# Patient Record
Sex: Female | Born: 1950 | Race: Black or African American | Hispanic: No | Marital: Single | Smoking: Former smoker
Health system: Southern US, Community
[De-identification: ages and names within clinical notes are randomized; demographics above are authoritative.]

## PROBLEM LIST (undated history)

## (undated) DIAGNOSIS — E785 Hyperlipidemia, unspecified: Secondary | ICD-10-CM

## (undated) DIAGNOSIS — M1990 Unspecified osteoarthritis, unspecified site: Secondary | ICD-10-CM

## (undated) DIAGNOSIS — E559 Vitamin D deficiency, unspecified: Secondary | ICD-10-CM

## (undated) DIAGNOSIS — E669 Obesity, unspecified: Secondary | ICD-10-CM

## (undated) DIAGNOSIS — B351 Tinea unguium: Secondary | ICD-10-CM

## (undated) DIAGNOSIS — R011 Cardiac murmur, unspecified: Secondary | ICD-10-CM

## (undated) DIAGNOSIS — F1010 Alcohol abuse, uncomplicated: Secondary | ICD-10-CM

## (undated) DIAGNOSIS — L659 Nonscarring hair loss, unspecified: Secondary | ICD-10-CM

## (undated) DIAGNOSIS — I110 Hypertensive heart disease with heart failure: Secondary | ICD-10-CM

## (undated) DIAGNOSIS — I509 Heart failure, unspecified: Secondary | ICD-10-CM

## (undated) DIAGNOSIS — I10 Essential (primary) hypertension: Secondary | ICD-10-CM

## (undated) DIAGNOSIS — F17221 Nicotine dependence, chewing tobacco, in remission: Secondary | ICD-10-CM

## (undated) DIAGNOSIS — F1910 Other psychoactive substance abuse, uncomplicated: Secondary | ICD-10-CM

## (undated) HISTORY — DX: Nonscarring hair loss, unspecified: L65.9

## (undated) HISTORY — DX: Essential (primary) hypertension: I10

## (undated) HISTORY — DX: Heart failure, unspecified: I50.9

## (undated) HISTORY — DX: Tinea unguium: B35.1

## (undated) HISTORY — DX: Other psychoactive substance abuse, uncomplicated: F19.10

## (undated) HISTORY — DX: Obesity, unspecified: E66.9

## (undated) HISTORY — DX: Hyperlipidemia, unspecified: E78.5

## (undated) HISTORY — DX: Unspecified osteoarthritis, unspecified site: M19.90

## (undated) HISTORY — DX: Hypertensive heart disease with heart failure: I11.0

## (undated) HISTORY — DX: Alcohol abuse, uncomplicated: F10.10

## (undated) HISTORY — DX: Cardiac murmur, unspecified: R01.1

## (undated) HISTORY — DX: Nicotine dependence, chewing tobacco, in remission: F17.221

## (undated) HISTORY — DX: Vitamin D deficiency, unspecified: E55.9

---

## 1998-06-02 ENCOUNTER — Emergency Department (HOSPITAL_COMMUNITY): Admission: EM | Admit: 1998-06-02 | Discharge: 1998-06-02 | Payer: Self-pay | Admitting: Emergency Medicine

## 1999-08-17 ENCOUNTER — Encounter: Payer: Self-pay | Admitting: *Deleted

## 1999-08-17 ENCOUNTER — Emergency Department (HOSPITAL_COMMUNITY): Admission: EM | Admit: 1999-08-17 | Discharge: 1999-08-17 | Payer: Self-pay | Admitting: *Deleted

## 1999-08-24 ENCOUNTER — Ambulatory Visit (HOSPITAL_COMMUNITY): Admission: RE | Admit: 1999-08-24 | Discharge: 1999-08-24 | Payer: Self-pay | Admitting: *Deleted

## 1999-08-24 ENCOUNTER — Other Ambulatory Visit: Admission: RE | Admit: 1999-08-24 | Discharge: 1999-08-24 | Payer: Self-pay | Admitting: *Deleted

## 2000-04-04 ENCOUNTER — Encounter: Admission: RE | Admit: 2000-04-04 | Discharge: 2000-04-04 | Payer: Self-pay | Admitting: *Deleted

## 2000-04-04 ENCOUNTER — Encounter: Payer: Self-pay | Admitting: *Deleted

## 2000-05-16 ENCOUNTER — Ambulatory Visit (HOSPITAL_COMMUNITY): Admission: RE | Admit: 2000-05-16 | Discharge: 2000-05-16 | Payer: Self-pay | Admitting: Cardiology

## 2000-05-16 ENCOUNTER — Encounter: Payer: Self-pay | Admitting: Cardiology

## 2000-06-08 ENCOUNTER — Ambulatory Visit (HOSPITAL_COMMUNITY): Admission: RE | Admit: 2000-06-08 | Discharge: 2000-06-08 | Payer: Self-pay | Admitting: Gastroenterology

## 2000-06-08 ENCOUNTER — Encounter: Payer: Self-pay | Admitting: Gastroenterology

## 2001-09-18 ENCOUNTER — Encounter: Payer: Self-pay | Admitting: *Deleted

## 2001-09-18 ENCOUNTER — Encounter: Admission: RE | Admit: 2001-09-18 | Discharge: 2001-09-18 | Payer: Self-pay | Admitting: *Deleted

## 2001-10-25 ENCOUNTER — Encounter (INDEPENDENT_AMBULATORY_CARE_PROVIDER_SITE_OTHER): Payer: Self-pay | Admitting: Specialist

## 2001-10-25 ENCOUNTER — Ambulatory Visit (HOSPITAL_COMMUNITY): Admission: RE | Admit: 2001-10-25 | Discharge: 2001-10-25 | Payer: Self-pay | Admitting: Obstetrics and Gynecology

## 2003-04-29 ENCOUNTER — Encounter: Admission: RE | Admit: 2003-04-29 | Discharge: 2003-04-29 | Payer: Self-pay | Admitting: Internal Medicine

## 2003-04-29 ENCOUNTER — Encounter: Payer: Self-pay | Admitting: Internal Medicine

## 2003-09-29 ENCOUNTER — Ambulatory Visit (HOSPITAL_COMMUNITY): Admission: RE | Admit: 2003-09-29 | Discharge: 2003-09-29 | Payer: Self-pay | Admitting: Gastroenterology

## 2008-09-05 ENCOUNTER — Emergency Department (HOSPITAL_COMMUNITY): Admission: EM | Admit: 2008-09-05 | Discharge: 2008-09-06 | Payer: Self-pay | Admitting: Emergency Medicine

## 2008-12-08 ENCOUNTER — Ambulatory Visit (HOSPITAL_COMMUNITY): Admission: RE | Admit: 2008-12-08 | Discharge: 2008-12-08 | Payer: Self-pay | Admitting: Obstetrics and Gynecology

## 2009-08-26 ENCOUNTER — Encounter: Admission: RE | Admit: 2009-08-26 | Discharge: 2009-09-24 | Payer: Self-pay | Admitting: Occupational Medicine

## 2009-10-08 ENCOUNTER — Ambulatory Visit: Payer: Self-pay | Admitting: Sports Medicine

## 2009-10-08 DIAGNOSIS — M719 Bursopathy, unspecified: Secondary | ICD-10-CM

## 2009-10-08 DIAGNOSIS — M75120 Complete rotator cuff tear or rupture of unspecified shoulder, not specified as traumatic: Secondary | ICD-10-CM

## 2009-10-08 DIAGNOSIS — M66329 Spontaneous rupture of flexor tendons, unspecified upper arm: Secondary | ICD-10-CM

## 2009-10-08 DIAGNOSIS — M67919 Unspecified disorder of synovium and tendon, unspecified shoulder: Secondary | ICD-10-CM | POA: Insufficient documentation

## 2009-10-12 ENCOUNTER — Encounter: Payer: Self-pay | Admitting: Sports Medicine

## 2009-11-03 ENCOUNTER — Encounter (INDEPENDENT_AMBULATORY_CARE_PROVIDER_SITE_OTHER): Payer: Self-pay | Admitting: *Deleted

## 2009-11-14 ENCOUNTER — Ambulatory Visit (HOSPITAL_COMMUNITY): Admission: RE | Admit: 2009-11-14 | Discharge: 2009-11-14 | Payer: Self-pay | Admitting: Sports Medicine

## 2010-03-24 ENCOUNTER — Encounter: Admission: RE | Admit: 2010-03-24 | Discharge: 2010-03-24 | Payer: Self-pay | Admitting: Specialist

## 2010-03-26 ENCOUNTER — Ambulatory Visit (HOSPITAL_BASED_OUTPATIENT_CLINIC_OR_DEPARTMENT_OTHER): Admission: RE | Admit: 2010-03-26 | Discharge: 2010-03-27 | Payer: Self-pay | Admitting: Specialist

## 2010-04-26 ENCOUNTER — Encounter: Admission: RE | Admit: 2010-04-26 | Discharge: 2010-07-23 | Payer: Self-pay | Admitting: Specialist

## 2010-11-08 ENCOUNTER — Encounter
Admission: RE | Admit: 2010-11-08 | Discharge: 2010-12-21 | Payer: Self-pay | Source: Home / Self Care | Attending: Specialist | Admitting: Specialist

## 2010-12-26 HISTORY — PX: ROTATOR CUFF REPAIR: SHX139

## 2011-03-18 LAB — CBC
HCT: 42 % (ref 36.0–46.0)
MCHC: 32.8 g/dL (ref 30.0–36.0)
MCV: 102.7 fL — ABNORMAL HIGH (ref 78.0–100.0)
Platelets: 263 10*3/uL (ref 150–400)
RDW: 13.9 % (ref 11.5–15.5)

## 2011-03-18 LAB — BASIC METABOLIC PANEL
BUN: 12 mg/dL (ref 6–23)
CO2: 28 mEq/L (ref 19–32)
Chloride: 105 mEq/L (ref 96–112)
GFR calc Af Amer: >60 mL/min (ref >60)
Glucose, Bld: 93 mg/dL (ref 70–99)
Potassium: 4.6 mEq/L (ref 3.5–5.1)

## 2011-05-13 NOTE — Procedures (Signed)
[facility]  Patient:    Megan Haney, Megan Haney Visit Number: [id number] MRN: [id number]          Service Type: DSU Location: DAY Attending Physician:  Lendon Colonel Dictated by:   Kathie Rhodes. Kyra Manges, M.D. Proc. Date: 10/25/01 Admit Date:  10/25/2001 Discharge Date: 10/25/2001                             Procedure Report  PREOPERATIVE DIAGNOSIS: Abnormal uterine bleeding with menorrhagia.  Uterine fibroids.  POSTOPERATIVE DIAGNOSIS:  Abnormal uterine bleeding with menorrhagia.  Uterine fibroids.  OPERATION:  Hysteroscopy with resection of large posterior cervical fibroid.  PROCEDURE:  The patient was placed in the lithotomy position and prepped and draped in the usual fashion.  A large myoma was resected with the resectoscope posteriorly.  There was an anterior indentation, but it was not resectable. All of the fibroid that was removed was resected.  During the early part of the procedure, the patient bucked, and the tenaculum tore through the cervix. As a result of this, I had to suture the cervix with 0 chromic sutures x2.  I injected the uterus with about 20 cc of a Pitressin solution.  Megan Haney tolerated the procedure well.  I did not see anything in the myoma that looked suspicious. Dictated by:   S. Kyra Manges, M.D. Attending Physician:  Lendon Colonel DD:  10/25/01 TD:  10/28/01 Job: [id number]

## 2011-09-28 LAB — POCT I-STAT, CHEM 8
BUN: 12
Glucose, Bld: 94
Potassium: 3.9
Sodium: 138

## 2011-09-28 LAB — CBC
Hemoglobin: 13.9
MCHC: 34.4
RBC: 4.06
RDW: 14.1
WBC: 10.1

## 2011-09-28 LAB — DIFFERENTIAL
Lymphocytes Relative: 23
Monocytes Relative: 7

## 2012-09-13 ENCOUNTER — Encounter: Payer: Self-pay | Admitting: Gastroenterology

## 2013-04-04 ENCOUNTER — Other Ambulatory Visit: Payer: Self-pay | Admitting: Occupational Medicine

## 2013-04-04 ENCOUNTER — Ambulatory Visit: Payer: Self-pay

## 2013-04-04 DIAGNOSIS — R7612 Nonspecific reaction to cell mediated immunity measurement of gamma interferon antigen response without active tuberculosis: Secondary | ICD-10-CM

## 2013-09-20 ENCOUNTER — Encounter (HOSPITAL_COMMUNITY): Payer: Self-pay | Admitting: Pharmacist

## 2013-09-27 ENCOUNTER — Inpatient Hospital Stay (HOSPITAL_COMMUNITY)
Admission: RE | Admit: 2013-09-27 | Discharge: 2013-09-27 | Disposition: A | Discharge status: Home / Self Care | Payer: Self-pay | Source: Ambulatory Visit

## 2013-10-03 ENCOUNTER — Encounter (HOSPITAL_COMMUNITY)
Admission: RE | Admit: 2013-10-03 | Discharge: 2013-10-03 | Disposition: A | Discharge status: Home / Self Care | Payer: 59 | Source: Ambulatory Visit | Attending: Obstetrics and Gynecology | Admitting: Obstetrics and Gynecology

## 2013-10-03 ENCOUNTER — Encounter (HOSPITAL_COMMUNITY): Payer: Self-pay | Admitting: Pharmacy Technician

## 2013-10-03 ENCOUNTER — Other Ambulatory Visit: Payer: Self-pay

## 2013-10-03 ENCOUNTER — Encounter (HOSPITAL_COMMUNITY): Payer: Self-pay

## 2013-10-03 LAB — BASIC METABOLIC PANEL
Calcium: 9 mg/dL (ref 8.4–10.5)
Chloride: 108 mEq/L (ref 96–112)
Creatinine, Ser: 0.66 mg/dL (ref 0.50–1.10)
GFR calc Af Amer: >90 mL/min (ref >90)
GFR calc non Af Amer: >90 mL/min (ref >90)
Glucose, Bld: 106 mg/dL — ABNORMAL HIGH (ref 70–99)
Potassium: 3.4 mEq/L — ABNORMAL LOW (ref 3.5–5.1)
Sodium: 139 mEq/L (ref 135–145)

## 2013-10-03 LAB — CBC
HCT: 35.5 % — ABNORMAL LOW (ref 36.0–46.0)
MCH: 33.7 pg (ref 26.0–34.0)
RDW: 12.9 % (ref 11.5–15.5)
WBC: 7.3 10*3/uL (ref 4.0–10.5)

## 2013-10-03 NOTE — Patient Instructions (Addendum)
Your procedure is scheduled on: 10/04/2013  Enter through the Main Entrance of [facility] at: 0600AM  Pick up the phone at the desk and dial [phone].  Call this number if you have problems the morning of surgery: [phone].  Remember: Do NOT eat food:AFTER MIDNIGHT 10/03/2013 Do NOT drink clear liquids after: AFTER MIDNIGHT 10/03/2013 Take these medicines the morning of surgery with a SIP OF WATER: none  Do NOT wear jewelry (body piercing), make-up, or nail polish. Do NOT wear lotions, powders, or perfumes.  You may wear deoderant. Do NOT shave for 48 hours prior to surgery. Do NOT bring valuables to the hospital. Contacts, dentures, or bridgework may not be worn into surgery. Have a responsible adult drive you home and stay with you for 24 hours after your procedure.

## 2013-10-04 ENCOUNTER — Encounter (HOSPITAL_COMMUNITY)
Admission: RE | Disposition: A | Discharge status: Home / Self Care | Payer: Self-pay | Source: Ambulatory Visit | Attending: Obstetrics and Gynecology

## 2013-10-04 ENCOUNTER — Encounter (HOSPITAL_COMMUNITY): Payer: 59 | Admitting: Anesthesiology

## 2013-10-04 ENCOUNTER — Ambulatory Visit (HOSPITAL_COMMUNITY)
Admission: RE | Admit: 2013-10-04 | Discharge: 2013-10-04 | Disposition: A | Discharge status: Home / Self Care | Payer: 59 | Source: Ambulatory Visit | Attending: Obstetrics and Gynecology | Admitting: Obstetrics and Gynecology

## 2013-10-04 ENCOUNTER — Ambulatory Visit (HOSPITAL_COMMUNITY): Payer: 59 | Admitting: Anesthesiology

## 2013-10-04 DIAGNOSIS — N950 Postmenopausal bleeding: Secondary | ICD-10-CM | POA: Insufficient documentation

## 2013-10-04 HISTORY — PX: DILATATION & CURETTAGE/HYSTEROSCOPY WITH TRUECLEAR: SHX6353

## 2013-10-04 SURGERY — DILATATION & CURETTAGE/HYSTEROSCOPY WITH TRUCLEAR
Anesthesia: General | Wound class: Clean Contaminated

## 2013-10-04 MED ORDER — FENTANYL CITRATE 0.05 MG/ML IJ SOLN: 25.0000 ug | INTRAMUSCULAR | Status: DC | PRN

## 2013-10-04 MED ORDER — FENTANYL CITRATE 0.05 MG/ML IJ SOLN
INTRAMUSCULAR | Status: DC | PRN
  Administered 2013-10-04 (×2): 25 ug via INTRAVENOUS
  Administered 2013-10-04: 50 ug via INTRAVENOUS

## 2013-10-04 MED ORDER — CEFAZOLIN SODIUM-DEXTROSE 2-3 GM-% IV SOLR
2.0000 g | INTRAVENOUS | Status: AC
  Administered 2013-10-04: 2 g via INTRAVENOUS

## 2013-10-04 MED ORDER — LIDOCAINE HCL (CARDIAC) 20 MG/ML IV SOLN
INTRAVENOUS | Status: DC | PRN
  Administered 2013-10-04: 30 mg via INTRAVENOUS

## 2013-10-04 MED ORDER — LIDOCAINE HCL 1 % IJ SOLN
INTRAMUSCULAR | Status: AC
  Filled 2013-10-04: qty 20

## 2013-10-04 MED ORDER — LACTATED RINGERS IV SOLN
INTRAVENOUS | Status: DC
  Administered 2013-10-04: 07:00:00 via INTRAVENOUS

## 2013-10-04 MED ORDER — MEPERIDINE HCL 25 MG/ML IJ SOLN: 6.2500 mg | INTRAMUSCULAR | Status: DC | PRN

## 2013-10-04 MED ORDER — MIDAZOLAM HCL 2 MG/2ML IJ SOLN
INTRAMUSCULAR | Status: AC
  Filled 2013-10-04: qty 2

## 2013-10-04 MED ORDER — MIDAZOLAM HCL 2 MG/2ML IJ SOLN: 0.5000 mg | Freq: Once | INTRAMUSCULAR | Status: DC | PRN

## 2013-10-04 MED ORDER — KETOROLAC TROMETHAMINE 30 MG/ML IJ SOLN
INTRAMUSCULAR | Status: DC | PRN
  Administered 2013-10-04: 15 mg via INTRAMUSCULAR

## 2013-10-04 MED ORDER — PROPOFOL 10 MG/ML IV BOLUS
INTRAVENOUS | Status: DC | PRN
  Administered 2013-10-04: 20 mg via INTRAVENOUS
  Administered 2013-10-04: 150 mg via INTRAVENOUS

## 2013-10-04 MED ORDER — LIDOCAINE HCL (CARDIAC) 20 MG/ML IV SOLN
INTRAVENOUS | Status: AC
  Filled 2013-10-04: qty 5

## 2013-10-04 MED ORDER — KETOROLAC TROMETHAMINE 30 MG/ML IJ SOLN
INTRAMUSCULAR | Status: AC
  Filled 2013-10-04: qty 1

## 2013-10-04 MED ORDER — CEFAZOLIN SODIUM-DEXTROSE 2-3 GM-% IV SOLR
INTRAVENOUS | Status: AC
  Filled 2013-10-04: qty 50

## 2013-10-04 MED ORDER — KETOROLAC TROMETHAMINE 30 MG/ML IJ SOLN: 15.0000 mg | Freq: Once | INTRAMUSCULAR | Status: DC | PRN

## 2013-10-04 MED ORDER — PROMETHAZINE HCL 25 MG/ML IJ SOLN: 6.2500 mg | INTRAMUSCULAR | Status: DC | PRN

## 2013-10-04 MED ORDER — GLYCINE 1.5 % IR SOLN: Status: DC | PRN

## 2013-10-04 MED ORDER — LIDOCAINE HCL 1 % IJ SOLN
INTRAMUSCULAR | Status: DC | PRN
  Administered 2013-10-04: 20 mL

## 2013-10-04 MED ORDER — ONDANSETRON HCL 4 MG/2ML IJ SOLN
INTRAMUSCULAR | Status: DC | PRN
  Administered 2013-10-04: 4 mg via INTRAMUSCULAR

## 2013-10-04 MED ORDER — FENTANYL CITRATE 0.05 MG/ML IJ SOLN
INTRAMUSCULAR | Status: AC
  Filled 2013-10-04: qty 2

## 2013-10-04 MED ORDER — SODIUM CHLORIDE 0.9 % IR SOLN
Status: DC | PRN
  Administered 2013-10-04: 3000 mL

## 2013-10-04 MED ORDER — ONDANSETRON HCL 4 MG/2ML IJ SOLN
INTRAMUSCULAR | Status: AC
  Filled 2013-10-04: qty 2

## 2013-10-04 MED ORDER — MIDAZOLAM HCL 2 MG/2ML IJ SOLN
INTRAMUSCULAR | Status: DC | PRN
  Administered 2013-10-04: 2 mg via INTRAVENOUS

## 2013-10-04 MED ORDER — PROPOFOL 10 MG/ML IV EMUL
INTRAVENOUS | Status: AC
  Filled 2013-10-04: qty 20

## 2013-10-04 SURGICAL SUPPLY — 19 items
BLADE INCISOR TRUC PLUS 2.9 (ABLATOR) IMPLANT
CANISTERS HI-FLOW 3000CC (CANNISTER) ×4 IMPLANT
CATH ROBINSON RED A/P 16FR (CATHETERS) ×2 IMPLANT
CLOTH BEACON ORANGE TIMEOUT ST (SAFETY) ×2 IMPLANT
CONTAINER PREFILL 10% NBF 60ML (FORM) ×4 IMPLANT
DRAPE HYSTEROSCOPY (DRAPE) ×2 IMPLANT
DRESSING TELFA 8X3 (GAUZE/BANDAGES/DRESSINGS) ×2 IMPLANT
ELECT REM PT RETURN 9FT ADLT (ELECTROSURGICAL) ×2
ELECTRODE REM PT RTRN 9FT ADLT (ELECTROSURGICAL) ×1 IMPLANT
GLOVE BIO SURGEON STRL SZ7.5 (GLOVE) ×4 IMPLANT
GOWN STRL REIN XL XLG (GOWN DISPOSABLE) ×4 IMPLANT
INCISOR TRUC PLUS BLADE 2.9 (ABLATOR)
KIT HYSTEROSCOPY TRUCLEAR (ABLATOR) IMPLANT
MORCELLATOR RECIP TRUCLEAR 4.0 (ABLATOR) IMPLANT
NEEDLE SPNL 22GX3.5 QUINCKE BK (NEEDLE) ×2 IMPLANT
PACK VAGINAL MINOR WOMEN LF (CUSTOM PROCEDURE TRAY) ×2 IMPLANT
PAD OB MATERNITY 4.3X12.25 (PERSONAL CARE ITEMS) ×2 IMPLANT
SYR CONTROL 10ML LL (SYRINGE) ×2 IMPLANT
TOWEL OR 17X24 6PK STRL BLUE (TOWEL DISPOSABLE) ×4 IMPLANT

## 2013-10-04 NOTE — Transfer of Care (Signed)
Immediate Anesthesia Transfer of Care Note  Patient: Megan Haney  Procedure(s) Performed: Procedure(s): DILATATION & CURETTAGE/HYSTEROSCOPY WITH TRUECLEAR (N/A)  Patient Location: PACU  Anesthesia Type:General  Level of Consciousness: awake and alert   Airway & Oxygen Therapy: Patient connected to nasal cannula oxygen  Post-op Assessment: Report given to PACU RN and Post -op Vital signs reviewed and stable  Post vital signs: Reviewed and stable  Complications: No apparent anesthesia complications

## 2013-10-04 NOTE — Anesthesia Postprocedure Evaluation (Signed)
  Anesthesia Post-op Note  Patient: Megan Haney  Procedure(s) Performed: Procedure(s): DILATATION & CURETTAGE/HYSTEROSCOPY WITH TRUECLEAR (N/A) Patient is awake and responsive. Pain and nausea are reasonably well controlled. Vital signs are stable and clinically acceptable. Oxygen saturation is clinically acceptable. There are no apparent anesthetic complications at this time. Patient is ready for discharge.

## 2013-10-04 NOTE — H&P (Signed)
Megan Haney is an 62 y.o. female with fibroids and PMB.  U/S c/w 12.8 x 7.9 x 9.0 cm uterus and fibroids of 1.8 to 4.8 cm .  SHG shows endometrial mass of 1.9 cm.  Pertinent Gynecological History: Menses: post-menopausal Bleeding: post menopausal bleeding Contraception: none DES exposure: denies Blood transfusions: none Sexually transmitted diseases: no past history Previous GYN Procedures: DNC  Last mammogram: normal Date: 2014 Last pap: normal Date: 2014 OB History: G0, P0   Menstrual History: Menarche age: unknown  No LMP recorded. Patient is postmenopausal.    No past medical history on file.  Past Surgical History  Procedure Laterality Date  . Rotator cuff repair Right 2012    No family history on file.  Social History:  reports that she has quit smoking. She does not have any smokeless tobacco history on file. She reports that she drinks alcohol. She reports that she does not use illicit drugs.  Allergies: No Known Allergies  Prescriptions prior to admission  Medication Sig Dispense Refill  . ibuprofen (ADVIL,MOTRIN) 200 MG tablet Take 800 mg by mouth daily as needed.         Review of Systems  Constitutional: Negative for fever.    Blood pressure 118/84, pulse 91, temperature 98.8 F (37.1 C), temperature source Oral, resp. rate 18, SpO2 97.00%. Physical Exam  Cardiovascular: Normal rate and regular rhythm.   Respiratory: Effort normal and breath sounds normal.  GI: Soft. There is no tenderness.  Genitourinary:  Uterus enlarged to about 18 weeks size Adnexal exam compromised    Results for orders placed during the hospital encounter of 10/03/13 (from the past 24 hour(s))  BASIC METABOLIC PANEL     Status: Abnormal   Collection Time    10/03/13 12:00 PM      Result Value Range   Sodium 139  135 - 145 mEq/L   Potassium 3.4 (*) 3.5 - 5.1 mEq/L   Chloride 108  96 - 112 mEq/L   CO2 23  19 - 32 mEq/L   Glucose, Bld 106 (*) 70 - 99 mg/dL   BUN 13  6 - 23  mg/dL   Creatinine, Ser 9.60  0.50 - 1.10 mg/dL   Calcium 9.0  8.4 - 45.4 mg/dL   GFR calc non Af Amer >90  >90 mL/min   GFR calc Af Amer >90  >90 mL/min  CBC     Status: Abnormal   Collection Time    10/03/13 12:00 PM      Result Value Range   WBC 7.3  4.0 - 10.5 K/uL   RBC 3.59 (*) 3.87 - 5.11 MIL/uL   Hemoglobin 12.1  12.0 - 15.0 g/dL   HCT 09.8 (*) 11.9 - 14.7 %   MCV 98.9  78.0 - 100.0 fL   MCH 33.7  26.0 - 34.0 pg   MCHC 34.1  30.0 - 36.0 g/dL   RDW 82.9  56.2 - 13.0 %   Platelets 287  150 - 400 K/uL    No results found.  Assessment/Plan: 62 yo G0P0 with PMB and probable endometrial polyp. D/W patient H/S, D&C, resection of EM polyp. D/W risks, all questions answered.  Loys Hoselton II,Marlies Ligman E 10/04/2013, 7:21 AM

## 2013-10-04 NOTE — Anesthesia Preprocedure Evaluation (Signed)
Anesthesia Evaluation  Patient identified by MRN, date of birth, ID band Patient awake    Reviewed: Allergy & Precautions, H&P , Patient's Chart, lab work & pertinent test results, reviewed documented beta blocker date and time   History of Anesthesia Complications Negative for: history of anesthetic complications  Airway Mallampati: II TM Distance: >3 FB Neck ROM: full    Dental no notable dental hx.    Pulmonary neg pulmonary ROS,  breath sounds clear to auscultation  Pulmonary exam normal       Cardiovascular Exercise Tolerance: Good negative cardio ROS  Rhythm:regular Rate:Normal     Neuro/Psych negative neurological ROS  negative psych ROS   GI/Hepatic negative GI ROS, Neg liver ROS,   Endo/Other  negative endocrine ROS  Renal/GU negative Renal ROS     Musculoskeletal   Abdominal   Peds  Hematology negative hematology ROS (+)   Anesthesia Other Findings   Reproductive/Obstetrics negative OB ROS                           Anesthesia Physical Anesthesia Plan  ASA: I  Anesthesia Plan: General LMA   Post-op Pain Management:    Induction:   Airway Management Planned:   Additional Equipment:   Intra-op Plan:   Post-operative Plan:   Informed Consent: I have reviewed the patients History and Physical, chart, labs and discussed the procedure including the risks, benefits and alternatives for the proposed anesthesia with the patient or authorized representative who has indicated his/her understanding and acceptance.   Dental Advisory Given  Plan Discussed with: CRNA, Surgeon and Anesthesiologist  Anesthesia Plan Comments:         Anesthesia Quick Evaluation

## 2013-10-04 NOTE — Brief Op Note (Signed)
10/04/2013  8:11 AM  PATIENT:  Megan Haney  62 y.o. female  PRE-OPERATIVE DIAGNOSIS:  Post menopausal bleeding  POST-OPERATIVE DIAGNOSIS:  Post menopausal bleeding  PROCEDURE:  Procedure(s): DILATATION & CURETTAGE/HYSTEROSCOPY WITH TRUECLEAR (N/A)  SURGEON:  Surgeon(s) and Role:    * Leslie Andrea, MD - Primary  PHYSICIAN ASSISTANT:   ASSISTANTS: none   ANESTHESIA:   general  EBL:     BLOOD ADMINISTERED:none  DRAINS: none   LOCAL MEDICATIONS USED:  LIDOCAINE   SPECIMEN:  Source of Specimen:  endometrial currettings and endometrial resection  DISPOSITION OF SPECIMEN:  PATHOLOGY  COUNTS:  YES  TOURNIQUET:  * No tourniquets in log *  DICTATION: .Other Dictation: Dictation Number [id number]  PLAN OF CARE: Discharge to home after PACU  PATIENT DISPOSITION:  PACU - hemodynamically stable.   Delay start of Pharmacological VTE agent (>24hrs) due to surgical blood loss or risk of bleeding: not applicable

## 2013-10-05 NOTE — Op Note (Signed)
NAMEFAIZA, BANSAL NO.:  [id number]  MEDICAL RECORD NO.:  [id number]  LOCATION:  [facility]                          FACILITY:  [facility]  PHYSICIAN:  Guy Sandifer. Henderson Cloud, M.D. DATE OF BIRTH:  1951/09/25  DATE OF PROCEDURE:  10/04/2013 DATE OF DISCHARGE:  10/04/2013                              OPERATIVE REPORT   PREOPERATIVE DIAGNOSIS:  Postmenopausal bleeding.  POSTOPERATIVE DIAGNOSIS:  Postmenopausal bleeding.  PROCEDURE:  Hysteroscopy with Truclear resection and dilation and curettage.  SURGEON:  Guy Sandifer. Henderson Cloud, MD  ANESTHESIA:  General with LMA.  ESTIMATED BLOOD LOSS:  Less than 50 mL.  I and o's of distending media 100 mL deficit.  SPECIMENS: 1. Endometrial curettings. 2. Endometrial resections both to pathology.  INDICATIONS AND CONSENT:  This patient is a 62 year old single female G0, P0 with postmenopausal bleeding and uterine leiomyomata.  Ultrasound consistent with multiple uterine leiomyomata.  Sonohysterogram is consistent with a probable 1.9 cm endometrial mass most consistent with polyp.  Hysteroscopy, D and C, Truclear resection has been discussed preoperatively.  Potential risks and complications were reviewed preoperatively including but not limited to, infection, uterine perforation, organ damage, bleeding requiring transfusion of blood products with HIV and hepatitis acquisition, DVT, PE, pneumonia, laparoscopy, laparotomy, persistent recurrent abnormal bleeding and/or polyps.  All questions have been answered.  Consent is signed on the chart.  FINDINGS:  There is 1.5 to 2 cm polypoid type mass on the right lower endometrial cavity just inside the cervical os.  Both fallopian tube and ostia are identified.  There is at least 1 broad-based submucous fibroid that is noted at the left lower endometrial canal with the vast majority being within the wall of the uterus.  DESCRIPTION OF PROCEDURE:  The patient was taken to the operating  room where she was identified and placed in dorsal supine position and general anesthesia was induced via LMA.  She was placed in a dorsal lithotomy position where she was prepped, catheterized and draped in sterile fashion.  Time-out was undertaken.  Bivalve speculum was placed. Anterior cervical lip was injected with 1% plain Xylocaine and grasped with single-tooth tenaculum.  Paracervical block was placed at 2, 4, 5, 7, 8, and 10 o'clock positions with approximately 20 mL of the same solution.  Cervix was gently progressively dilated.  Truclear hysteroscope was placed in the  endocervical canal and advanced under direct visualization without difficulty.  The above findings were noted. The Truclear was then used to resect the polypoid type mass as well as a single 6 mm submucous fibroid on the anterior endometrial cavity.  The hysteroscope was then removed and sharp curettage was carried out for scant tissue.  Good hemostasis was noted.  All instruments were removed.  All counts were correct.  The patient was awake and taken to recovery room in stable condition.     Guy Sandifer Henderson Cloud, M.D.     JET/MEDQ  D:  10/04/2013  T:  10/05/2013  Job:  [id number]

## 2013-10-07 ENCOUNTER — Encounter (HOSPITAL_COMMUNITY): Payer: Self-pay | Admitting: Obstetrics and Gynecology

## 2013-10-10 ENCOUNTER — Other Ambulatory Visit: Payer: Self-pay | Admitting: Obstetrics and Gynecology

## 2013-10-10 DIAGNOSIS — R928 Other abnormal and inconclusive findings on diagnostic imaging of breast: Secondary | ICD-10-CM

## 2013-10-29 ENCOUNTER — Ambulatory Visit
Admission: RE | Admit: 2013-10-29 | Discharge: 2013-10-29 | Disposition: A | Discharge status: Home / Self Care | Payer: 59 | Source: Ambulatory Visit | Attending: Obstetrics and Gynecology | Admitting: Obstetrics and Gynecology

## 2013-10-29 DIAGNOSIS — R928 Other abnormal and inconclusive findings on diagnostic imaging of breast: Secondary | ICD-10-CM

## 2014-04-24 ENCOUNTER — Ambulatory Visit: Payer: PRIVATE HEALTH INSURANCE

## 2014-04-24 ENCOUNTER — Other Ambulatory Visit: Payer: Self-pay | Admitting: Occupational Medicine

## 2014-04-24 DIAGNOSIS — R52 Pain, unspecified: Secondary | ICD-10-CM

## 2014-04-30 ENCOUNTER — Ambulatory Visit: Payer: PRIVATE HEALTH INSURANCE | Attending: Family Medicine | Admitting: Occupational Therapy

## 2014-04-30 DIAGNOSIS — M6281 Muscle weakness (generalized): Secondary | ICD-10-CM | POA: Insufficient documentation

## 2014-04-30 DIAGNOSIS — IMO0001 Reserved for inherently not codable concepts without codable children: Secondary | ICD-10-CM | POA: Insufficient documentation

## 2014-05-05 ENCOUNTER — Ambulatory Visit: Payer: PRIVATE HEALTH INSURANCE | Attending: Family Medicine | Admitting: Occupational Therapy

## 2014-05-05 DIAGNOSIS — M6281 Muscle weakness (generalized): Secondary | ICD-10-CM | POA: Insufficient documentation

## 2014-05-05 DIAGNOSIS — IMO0001 Reserved for inherently not codable concepts without codable children: Secondary | ICD-10-CM | POA: Insufficient documentation

## 2014-05-06 ENCOUNTER — Encounter: Payer: Self-pay | Admitting: Occupational Therapy

## 2014-05-09 ENCOUNTER — Ambulatory Visit: Payer: PRIVATE HEALTH INSURANCE | Admitting: Occupational Therapy

## 2014-05-13 ENCOUNTER — Ambulatory Visit: Payer: PRIVATE HEALTH INSURANCE | Attending: Family Medicine | Admitting: Occupational Therapy

## 2014-05-13 DIAGNOSIS — S60229A Contusion of unspecified hand, initial encounter: Secondary | ICD-10-CM | POA: Diagnosis not present

## 2014-05-13 DIAGNOSIS — IMO0001 Reserved for inherently not codable concepts without codable children: Secondary | ICD-10-CM | POA: Insufficient documentation

## 2014-05-13 DIAGNOSIS — M6281 Muscle weakness (generalized): Secondary | ICD-10-CM | POA: Diagnosis not present

## 2014-05-13 DIAGNOSIS — X58XXXA Exposure to other specified factors, initial encounter: Secondary | ICD-10-CM | POA: Insufficient documentation

## 2014-05-15 ENCOUNTER — Encounter: Payer: Self-pay | Admitting: Occupational Therapy

## 2014-05-16 ENCOUNTER — Encounter: Payer: Self-pay | Admitting: Occupational Therapy

## 2014-05-20 ENCOUNTER — Ambulatory Visit: Payer: PRIVATE HEALTH INSURANCE | Attending: Family Medicine | Admitting: Occupational Therapy

## 2014-05-20 DIAGNOSIS — M6281 Muscle weakness (generalized): Secondary | ICD-10-CM | POA: Insufficient documentation

## 2014-05-20 DIAGNOSIS — IMO0002 Reserved for concepts with insufficient information to code with codable children: Secondary | ICD-10-CM | POA: Insufficient documentation

## 2014-05-20 DIAGNOSIS — IMO0001 Reserved for inherently not codable concepts without codable children: Secondary | ICD-10-CM | POA: Insufficient documentation

## 2014-05-23 ENCOUNTER — Ambulatory Visit: Payer: PRIVATE HEALTH INSURANCE | Attending: Family Medicine | Admitting: Occupational Therapy

## 2014-05-23 DIAGNOSIS — IMO0001 Reserved for inherently not codable concepts without codable children: Secondary | ICD-10-CM | POA: Diagnosis not present

## 2014-05-23 DIAGNOSIS — M6281 Muscle weakness (generalized): Secondary | ICD-10-CM | POA: Diagnosis not present

## 2014-05-23 DIAGNOSIS — IMO0002 Reserved for concepts with insufficient information to code with codable children: Secondary | ICD-10-CM | POA: Diagnosis not present

## 2014-05-26 ENCOUNTER — Ambulatory Visit: Payer: PRIVATE HEALTH INSURANCE | Attending: Family Medicine | Admitting: Occupational Therapy

## 2014-05-26 DIAGNOSIS — M6281 Muscle weakness (generalized): Secondary | ICD-10-CM | POA: Diagnosis not present

## 2014-05-26 DIAGNOSIS — IMO0001 Reserved for inherently not codable concepts without codable children: Secondary | ICD-10-CM | POA: Insufficient documentation

## 2014-05-29 ENCOUNTER — Encounter: Payer: Self-pay | Admitting: Occupational Therapy

## 2014-08-13 ENCOUNTER — Other Ambulatory Visit (HOSPITAL_COMMUNITY): Payer: Self-pay | Admitting: Specialist

## 2014-08-13 DIAGNOSIS — R52 Pain, unspecified: Secondary | ICD-10-CM

## 2014-08-13 DIAGNOSIS — M25511 Pain in right shoulder: Secondary | ICD-10-CM

## 2014-08-22 ENCOUNTER — Ambulatory Visit (HOSPITAL_COMMUNITY): Admission: RE | Admit: 2014-08-22 | Payer: 59 | Source: Ambulatory Visit

## 2014-08-22 ENCOUNTER — Ambulatory Visit (HOSPITAL_COMMUNITY): Payer: 59

## 2014-09-19 ENCOUNTER — Ambulatory Visit (HOSPITAL_COMMUNITY)
Admission: RE | Admit: 2014-09-19 | Discharge: 2014-09-19 | Disposition: A | Discharge status: Home / Self Care | Payer: PRIVATE HEALTH INSURANCE | Source: Ambulatory Visit | Attending: Specialist | Admitting: Specialist

## 2014-09-19 DIAGNOSIS — M25511 Pain in right shoulder: Secondary | ICD-10-CM

## 2014-09-19 DIAGNOSIS — M25519 Pain in unspecified shoulder: Secondary | ICD-10-CM | POA: Insufficient documentation

## 2014-11-24 ENCOUNTER — Other Ambulatory Visit: Payer: Self-pay | Admitting: Obstetrics and Gynecology

## 2014-11-24 DIAGNOSIS — R921 Mammographic calcification found on diagnostic imaging of breast: Secondary | ICD-10-CM

## 2014-12-05 ENCOUNTER — Ambulatory Visit
Admission: RE | Admit: 2014-12-05 | Discharge: 2014-12-05 | Disposition: A | Discharge status: Home / Self Care | Payer: 59 | Source: Ambulatory Visit | Attending: Obstetrics and Gynecology | Admitting: Obstetrics and Gynecology

## 2014-12-05 DIAGNOSIS — R921 Mammographic calcification found on diagnostic imaging of breast: Secondary | ICD-10-CM

## 2015-12-02 ENCOUNTER — Other Ambulatory Visit: Payer: Self-pay

## 2015-12-02 DIAGNOSIS — Z1231 Encounter for screening mammogram for malignant neoplasm of breast: Secondary | ICD-10-CM

## 2016-01-05 ENCOUNTER — Ambulatory Visit: Payer: Self-pay

## 2016-01-20 DIAGNOSIS — M1712 Unilateral primary osteoarthritis, left knee: Secondary | ICD-10-CM | POA: Diagnosis not present

## 2016-01-20 MED FILL — MELOXICAM 15 MG TABLET: 15 | 30 days supply | Qty: 30 | Fill #0

## 2016-02-08 ENCOUNTER — Ambulatory Visit
Admission: RE | Admit: 2016-02-08 | Discharge: 2016-02-08 | Disposition: A | Discharge status: Home / Self Care | Payer: 59 | Source: Ambulatory Visit

## 2016-02-08 DIAGNOSIS — Z1231 Encounter for screening mammogram for malignant neoplasm of breast: Secondary | ICD-10-CM | POA: Diagnosis not present

## 2016-07-12 DIAGNOSIS — R102 Pelvic and perineal pain: Secondary | ICD-10-CM | POA: Diagnosis not present

## 2016-07-12 DIAGNOSIS — L659 Nonscarring hair loss, unspecified: Secondary | ICD-10-CM | POA: Diagnosis not present

## 2016-07-12 DIAGNOSIS — L639 Alopecia areata, unspecified: Secondary | ICD-10-CM | POA: Diagnosis not present

## 2016-08-02 DIAGNOSIS — D251 Intramural leiomyoma of uterus: Secondary | ICD-10-CM | POA: Diagnosis not present

## 2016-08-02 DIAGNOSIS — R102 Pelvic and perineal pain: Secondary | ICD-10-CM | POA: Diagnosis not present

## 2016-08-02 DIAGNOSIS — N852 Hypertrophy of uterus: Secondary | ICD-10-CM | POA: Diagnosis not present

## 2017-06-26 ENCOUNTER — Encounter (HOSPITAL_COMMUNITY): Payer: Self-pay | Admitting: Emergency Medicine

## 2017-06-26 DIAGNOSIS — Z5321 Procedure and treatment not carried out due to patient leaving prior to being seen by health care provider: Secondary | ICD-10-CM | POA: Insufficient documentation

## 2017-06-26 DIAGNOSIS — M25561 Pain in right knee: Secondary | ICD-10-CM | POA: Insufficient documentation

## 2017-06-26 NOTE — ED Triage Notes (Signed)
Patient reports right knee pain radiating down to her right lower leg onset today , denies injury/ambulatory .

## 2017-06-27 ENCOUNTER — Emergency Department (HOSPITAL_COMMUNITY)
Admission: EM | Admit: 2017-06-27 | Discharge: 2017-06-27 | Disposition: A | Discharge status: Home / Self Care | Payer: Medicare Other | Attending: Emergency Medicine | Admitting: Emergency Medicine

## 2017-06-27 ENCOUNTER — Emergency Department (HOSPITAL_COMMUNITY)
Admission: EM | Admit: 2017-06-27 | Discharge: 2017-06-27 | Discharge status: Against Medical Advice | Payer: Medicare Other | Attending: Emergency Medicine | Admitting: Emergency Medicine

## 2017-06-27 ENCOUNTER — Encounter (HOSPITAL_COMMUNITY): Payer: Self-pay | Admitting: Emergency Medicine

## 2017-06-27 ENCOUNTER — Emergency Department (HOSPITAL_COMMUNITY): Payer: Medicare Other

## 2017-06-27 DIAGNOSIS — M2241 Chondromalacia patellae, right knee: Secondary | ICD-10-CM | POA: Diagnosis not present

## 2017-06-27 DIAGNOSIS — Z87891 Personal history of nicotine dependence: Secondary | ICD-10-CM | POA: Diagnosis not present

## 2017-06-27 DIAGNOSIS — M25561 Pain in right knee: Secondary | ICD-10-CM | POA: Diagnosis not present

## 2017-06-27 DIAGNOSIS — M1711 Unilateral primary osteoarthritis, right knee: Secondary | ICD-10-CM | POA: Insufficient documentation

## 2017-06-27 LAB — I-STAT CHEM 8, ED
BUN: 11 mg/dL (ref 6–20)
CALCIUM ION: 1.21 mmol/L (ref 1.15–1.40)
CREATININE: 0.6 mg/dL (ref 0.44–1.00)
Chloride: 109 mmol/L (ref 101–111)
GLUCOSE: 91 mg/dL (ref 65–99)
HCT: 38 % (ref 36.0–46.0)
HEMOGLOBIN: 12.9 g/dL (ref 12.0–15.0)
Potassium: 3.5 mmol/L (ref 3.5–5.1)
Sodium: 145 mmol/L (ref 135–145)
TCO2: 24 mmol/L (ref 0–100)

## 2017-06-27 LAB — D-DIMER, QUANTITATIVE: D-Dimer, Quant: 0.62 ug/mL-FEU — ABNORMAL HIGH (ref 0.00–0.50)

## 2017-06-27 MED ORDER — TRAMADOL HCL 50 MG PO TABS
50.0000 mg | ORAL_TABLET | Freq: Once | ORAL | Status: DC
  Filled 2017-06-27: qty 1

## 2017-06-27 MED ORDER — NAPROXEN 250 MG PO TABS
375.0000 mg | ORAL_TABLET | Freq: Once | ORAL | Status: AC
  Administered 2017-06-27: 375 mg via ORAL
  Filled 2017-06-27: qty 2

## 2017-06-27 MED ORDER — NAPROXEN 375 MG PO TABS: 375.0000 mg | ORAL_TABLET | Freq: Two times a day (BID) | ORAL | 0 refills | Status: AC | PRN

## 2017-06-27 MED ORDER — TRAMADOL HCL 50 MG PO TABS: 50.0000 mg | ORAL_TABLET | Freq: Four times a day (QID) | ORAL | 0 refills | Status: DC | PRN

## 2017-06-27 NOTE — ED Notes (Signed)
Patient Alert and oriented X4. Stable and ambulatory. Patient verbalized understanding of the discharge instructions.  Patient belongings were taken by the patient.  

## 2017-06-27 NOTE — ED Notes (Signed)
Called pt name x3 for vitals in lobby. No response.

## 2017-06-27 NOTE — ED Notes (Addendum)
Patient up to desk.  States she was concerned about her leg, but at this time has decided "I'm not going to die" and states she has to go home to care for her 74+ year old mother who currently had someone sitting with her.  This Rn encouraged patient to consider staying for further evaluation, and patient states she must get home and will follow up "with my ortho doctor tomorrow morning".  Return precautions reviewed.

## 2017-06-27 NOTE — Discharge Instructions (Signed)
Your X-Ray showed arthritis of the patella (kneecap), which is likely causing your symptoms.   We will give you a knee brace which may help with these symptoms.  Your D-Dimer (a test for blood clots), was 0.62. While this is above the test range of 0.5, when adjusting for your age, this is considered negative. If your symptoms do not improve with the treatment, return to the ER for further work-up.

## 2017-06-27 NOTE — ED Provider Notes (Signed)
[place] DEPT Provider Note   CSN: [id number] Arrival date & time: 06/27/17  1414     History   Chief Complaint Chief Complaint  Patient presents with  . Leg Pain  . Knee Pain    HPI Megan Haney is a 66 y.o. female.  HPI   66 yo F here with atraumatic right knee pain. Started approx 4-5 days ago randomly, without trauma. Yesterday, her knee became stiff, tender, and painful, with moderate pain behind leg as well wrapping around to front. Pain worse as day goes on, though does have some mild stiffness in the morning. Pain is throbbing, 5/10, worse with ambulation, prolonged standing, and movement. Improves with rest and elevation. Did try ibuprofen yesterday with some improvement. No CP, SOB, no recent periods of immobilization or surgeries. Last surgery was 4 years ago. No hormone tx. Former smoker, does not currently smoke.  History reviewed. No pertinent past medical history.  Patient Active Problem List   Diagnosis Date Noted  . [place] SHOULDER REGION 10/08/2009  . COMPLETE RUPTURE OF ROTATOR CUFF 10/08/2009  . BICEPS TENDON RUPTURE, RIGHT 10/08/2009    Past Surgical History:  Procedure Laterality Date  . DILATATION & CURETTAGE/HYSTEROSCOPY WITH TRUECLEAR N/A 10/04/2013   Procedure: [facility];  Surgeon: Shon Millet II, MD;  Location: [facility];  Service: Gynecology;  Laterality: N/A;  . ROTATOR CUFF REPAIR Right 2012    OB History    No data available       Home Medications    Prior to Admission medications   Medication Sig Start Date End Date Taking? Authorizing Provider  ibuprofen (ADVIL,MOTRIN) 200 MG tablet Take 800 mg by mouth daily as needed.     [provider]  naproxen (NAPROSYN) 375 MG tablet Take 1 tablet (375 mg total) by mouth 2 (two) times daily as needed for moderate pain. 06/27/17 07/04/17  Duffy Bruce, MD  traMADol (ULTRAM) 50 MG tablet Take 1 tablet (50 mg total)  by mouth every 6 (six) hours as needed for severe pain. 06/27/17   Duffy Bruce, MD    Family History No family history on file.  Social History Social History  Substance Use Topics  . Smoking status: Former Research scientist (life sciences)  . Smokeless tobacco: Never Used  . Alcohol use 0.0 oz/week    1 - 2 Glasses of wine per week     Comment: three times per week     Allergies   Patient has no known allergies.   Review of Systems Review of Systems  Musculoskeletal: Positive for arthralgias, gait problem and joint swelling.  All other systems reviewed and are negative.    Physical Exam Updated Vital Signs BP (!) 153/79 (BP Location: Left Arm)   Pulse 63   Temp 98.1 F (36.7 C) (Oral)   Resp 18   Ht 5' 2.5" (1.588 m)   Wt 71.2 kg (157 lb)   SpO2 98%   BMI 28.26 kg/m   Physical Exam  Constitutional: She is oriented to person, place, and time. She appears well-developed and well-nourished. No distress.  HENT:  Head: Normocephalic and atraumatic.  Eyes: Conjunctivae are normal.  Neck: Neck supple.  Cardiovascular: Normal rate, regular rhythm and normal heart sounds.   Pulmonary/Chest: Effort normal. No respiratory distress. She has no wheezes.  Abdominal: She exhibits no distension.  Musculoskeletal: She exhibits no edema.  Neurological: She is alert and oriented to person, place, and time. She exhibits normal muscle tone.  Skin: Skin is warm. Capillary refill takes less than 2 seconds. No rash noted.  Nursing note and vitals reviewed.   LOWER EXTREMITY EXAM: RIGHT  INSPECTION & PALPATION: No gross deformity.  Minimal anterior joint swelling No open wounds.  Mild TTP over anterior and posterior joint line, as well as patella. No popliteal TTP or fullness. No overt edema of b/l legs.  SENSORY: sensation is intact to light touch in:  Superficial peroneal nerve distribution (over dorsum of foot) Deep peroneal nerve distribution (over first dorsal web space) Sural nerve  distribution (over lateral aspect 5th metatarsal) Saphenous nerve distribution (over medial instep)  MOTOR:  + Motor EHL (great toe dorsiflexion) + FHL (great toe plantar flexion)  + TA (ankle dorsiflexion)  + GSC (ankle plantar flexion)  VASCULAR: 2+ dorsalis pedis and posterior tibialis pulses Capillary refill < 2 sec, toes warm and well-perfused  COMPARTMENTS: Soft, warm, well-perfused No pain with passive extension No parethesias   ED Treatments / Results  Labs (all labs ordered are listed, but only abnormal results are displayed) Labs Reviewed  D-DIMER, QUANTITATIVE (NOT AT [facility]) - Abnormal; Notable for the following:       Result Value   D-Dimer, Quant 0.62 (*)    All other components within normal limits  I-STAT CHEM 8, ED    EKG  EKG Interpretation None       Radiology Dg Knee Complete 4 Views Right  Result Date: 06/27/2017 CLINICAL DATA:  Right knee pain extending into the right lower leg for 5 days, no injury EXAM: RIGHT KNEE - COMPLETE 4+ VIEW COMPARISON:  None. FINDINGS: No fracture or effusion is seen. The joint spaces are relatively well preserved. No significant degenerative spurring is seen. There is slight irregularity of the posterior aspect of the patella which may indicate patellar chondromalacia. IMPRESSION: 1. No acute abnormality.  No significant degenerative change. 2. Possible patellar chondromalacia . Electronically Signed   By: Ivar Drape M.D.   On: 06/27/2017 17:06    Procedures Procedures (including critical care time)  Medications Ordered in ED Medications  naproxen (NAPROSYN) tablet 375 mg (375 mg Oral Given 06/27/17 1812)     Initial Impression / Assessment and Plan / ED Course  I have reviewed the triage vital signs and the nursing notes.  Pertinent labs & imaging results that were available during my care of the patient were reviewed by me and considered in my medical decision making (see chart for details).     66 yo F with  PMHx as above here with right knee pain. History and exam is highly c/w likely OA of right knee, with pain worse while standing/moving, improves with rest. No calf swelling, edema, asymmetry, no estrogen use, no high risk med use, no recent immobilization and Well's score is 0 - D-Dimer checked and age-adjusted D-Dimer is below threshold - do not suspect DVT. No signs of septic knee. No evidence of NV compromise. Will tx with NSAIDs, outpt follow-up.  Final Clinical Impressions(s) / ED Diagnoses   Final diagnoses:  Chondromalacia of right patella  Arthritis of right knee    New Prescriptions Discharge Medication List as of 06/27/2017  6:12 PM    START taking these medications   Details  naproxen (NAPROSYN) 375 MG tablet Take 1 tablet (375 mg total) by mouth 2 (two) times daily as needed for moderate pain., Starting Tue 06/27/2017, Until Tue 07/04/2017, Print    traMADol (ULTRAM) 50 MG tablet Take 1 tablet (50 mg total) by  mouth every 6 (six) hours as needed for severe pain., Starting Tue 06/27/2017, Print         Duffy Bruce, MD 06/28/17 [phone]

## 2017-07-19 DIAGNOSIS — M170 Bilateral primary osteoarthritis of knee: Secondary | ICD-10-CM | POA: Diagnosis not present

## 2017-07-26 DIAGNOSIS — M1711 Unilateral primary osteoarthritis, right knee: Secondary | ICD-10-CM | POA: Diagnosis not present

## 2017-08-02 DIAGNOSIS — M25561 Pain in right knee: Secondary | ICD-10-CM | POA: Diagnosis not present

## 2017-08-07 DIAGNOSIS — M1711 Unilateral primary osteoarthritis, right knee: Secondary | ICD-10-CM | POA: Diagnosis not present

## 2018-11-29 ENCOUNTER — Other Ambulatory Visit: Payer: Self-pay | Admitting: Obstetrics and Gynecology

## 2018-11-29 DIAGNOSIS — Z1231 Encounter for screening mammogram for malignant neoplasm of breast: Secondary | ICD-10-CM

## 2019-01-11 ENCOUNTER — Ambulatory Visit
Admission: RE | Admit: 2019-01-11 | Discharge: 2019-01-11 | Disposition: A | Discharge status: Home / Self Care | Payer: Medicare Other | Source: Ambulatory Visit | Attending: Obstetrics and Gynecology | Admitting: Obstetrics and Gynecology

## 2019-01-11 DIAGNOSIS — Z1231 Encounter for screening mammogram for malignant neoplasm of breast: Secondary | ICD-10-CM

## 2019-08-05 ENCOUNTER — Emergency Department (HOSPITAL_COMMUNITY)
Admission: EM | Admit: 2019-08-05 | Discharge: 2019-08-05 | Disposition: A | Discharge status: Home / Self Care | Payer: Medicare Other | Attending: Emergency Medicine | Admitting: Emergency Medicine

## 2019-08-05 ENCOUNTER — Other Ambulatory Visit: Payer: Self-pay

## 2019-08-05 ENCOUNTER — Emergency Department (HOSPITAL_COMMUNITY): Payer: Medicare Other

## 2019-08-05 ENCOUNTER — Encounter (HOSPITAL_COMMUNITY): Payer: Self-pay

## 2019-08-05 DIAGNOSIS — Y998 Other external cause status: Secondary | ICD-10-CM | POA: Insufficient documentation

## 2019-08-05 DIAGNOSIS — S060X9A Concussion with loss of consciousness of unspecified duration, initial encounter: Secondary | ICD-10-CM | POA: Diagnosis not present

## 2019-08-05 DIAGNOSIS — Y9389 Activity, other specified: Secondary | ICD-10-CM | POA: Insufficient documentation

## 2019-08-05 DIAGNOSIS — R112 Nausea with vomiting, unspecified: Secondary | ICD-10-CM | POA: Diagnosis not present

## 2019-08-05 DIAGNOSIS — I10 Essential (primary) hypertension: Secondary | ICD-10-CM | POA: Insufficient documentation

## 2019-08-05 DIAGNOSIS — Z87891 Personal history of nicotine dependence: Secondary | ICD-10-CM | POA: Insufficient documentation

## 2019-08-05 DIAGNOSIS — S199XXA Unspecified injury of neck, initial encounter: Secondary | ICD-10-CM | POA: Diagnosis not present

## 2019-08-05 DIAGNOSIS — R030 Elevated blood-pressure reading, without diagnosis of hypertension: Secondary | ICD-10-CM | POA: Diagnosis not present

## 2019-08-05 DIAGNOSIS — S0990XA Unspecified injury of head, initial encounter: Secondary | ICD-10-CM | POA: Diagnosis not present

## 2019-08-05 DIAGNOSIS — S060X0A Concussion without loss of consciousness, initial encounter: Secondary | ICD-10-CM | POA: Diagnosis not present

## 2019-08-05 DIAGNOSIS — Y92012 Bathroom of single-family (private) house as the place of occurrence of the external cause: Secondary | ICD-10-CM | POA: Diagnosis not present

## 2019-08-05 DIAGNOSIS — W01198A Fall on same level from slipping, tripping and stumbling with subsequent striking against other object, initial encounter: Secondary | ICD-10-CM | POA: Diagnosis not present

## 2019-08-05 LAB — BASIC METABOLIC PANEL
Anion gap: 9 (ref 5–15)
BUN: 5 mg/dL — ABNORMAL LOW (ref 8–23)
CO2: 24 mmol/L (ref 22–32)
Calcium: 9.6 mg/dL (ref 8.9–10.3)
Chloride: 107 mmol/L (ref 98–111)
Creatinine, Ser: 0.8 mg/dL (ref 0.44–1.00)
GFR calc Af Amer: >60 mL/min (ref >60)
GFR calc non Af Amer: >60 mL/min (ref >60)
Glucose, Bld: 102 mg/dL — ABNORMAL HIGH (ref 70–99)
Potassium: 3.5 mmol/L (ref 3.5–5.1)
Sodium: 140 mmol/L (ref 135–145)

## 2019-08-05 LAB — CBC
HCT: 41.6 % (ref 36.0–46.0)
Hemoglobin: 14.1 g/dL (ref 12.0–15.0)
MCH: 34.8 pg — ABNORMAL HIGH (ref 26.0–34.0)
MCHC: 33.9 g/dL (ref 30.0–36.0)
MCV: 102.7 fL — ABNORMAL HIGH (ref 80.0–100.0)
Platelets: 193 10*3/uL (ref 150–400)
RBC: 4.05 MIL/uL (ref 3.87–5.11)
RDW: 12.2 % (ref 11.5–15.5)
WBC: 7.3 10*3/uL (ref 4.0–10.5)
nRBC: 0 % (ref 0.0–0.2)

## 2019-08-05 MED ORDER — AMLODIPINE BESYLATE 5 MG PO TABS
5.0000 mg | ORAL_TABLET | Freq: Once | ORAL | Status: AC
  Administered 2019-08-05: 5 mg via ORAL
  Filled 2019-08-05: qty 1

## 2019-08-05 MED ORDER — AMLODIPINE BESYLATE 5 MG PO TABS: 5.0000 mg | ORAL_TABLET | Freq: Every day | ORAL | 0 refills | Status: DC

## 2019-08-05 MED ORDER — ACETAMINOPHEN 325 MG PO TABS
650.0000 mg | ORAL_TABLET | Freq: Once | ORAL | Status: AC
  Administered 2019-08-05: 650 mg via ORAL
  Filled 2019-08-05: qty 2

## 2019-08-05 NOTE — ED Notes (Addendum)
Pt came up to tech asking how much longer it will be. Tech told pt that I am unable to tll her how much longer. Pt stated, "a white person went before me and im black. That's why I haven't gone. Im going to tell your president about this" Pt looked at techs screen and tech told pt she can not do that, that it is a HIPPA violation. Pt got very agitated and started saying "its because im black" and walked away.

## 2019-08-05 NOTE — ED Notes (Signed)
PT refused wheelchair, pt walked with this tech to room 32 from waiting room.

## 2019-08-05 NOTE — Discharge Instructions (Signed)
Follow-up with your primary care doctor to check on your blood pressure.  Take Tylenol for the headache associated with your head injury.  Your symptoms should improve over the next several days to week.

## 2019-08-05 NOTE — ED Triage Notes (Signed)
Pt arrives POV for eval of fall this AM w/ loss of balance. Pt reports she fell around 0400 this AM while losing her balance in the bathroom, states she struck her head. No blood thinners. Pt reports HTN, w/ hx of same. Endorses ongoing HA since fall, Endorses blt neck pain, no midline tenderness

## 2019-08-05 NOTE — ED Provider Notes (Signed)
[facility] EMERGENCY DEPARTMENT Provider Note   CSN: [id number] Arrival date & time: 08/05/19  1323    History   Chief Complaint Chief Complaint  Patient presents with  . Fall    HPI Megan Haney is a 68 y.o. female.     HPI Patient presents to the emergency room for evaluation after a fall this morning.  Patient states she was going to the bathroom this morning.  She lost her balance and fell backwards striking her head.  Patient did become nauseated and vomited couple times after that.  Throughout the day she continued to feel nauseated and continues to have a headache.  She has some neck pain as well.  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  Patient denies any trouble with her balance or coordination.  No abdominal pain.  No chest pain. History reviewed. No pertinent past medical history.  Patient Active Problem List   Diagnosis Date Noted  . [place] SHOULDER REGION 10/08/2009  . COMPLETE RUPTURE OF ROTATOR CUFF 10/08/2009  . BICEPS TENDON RUPTURE, RIGHT 10/08/2009    Past Surgical History:  Procedure Laterality Date  . DILATATION & CURETTAGE/HYSTEROSCOPY WITH TRUECLEAR N/A 10/04/2013   Procedure: [place];  Surgeon: Shon Millet II, MD;  Location: [facility];  Service: Gynecology;  Laterality: N/A;  . ROTATOR CUFF REPAIR Right 2012     OB History   No obstetric history on file.      Home Medications    Prior to Admission medications   Medication Sig Start Date End Date Taking? Authorizing Provider  ibuprofen (ADVIL,MOTRIN) 200 MG tablet Take 600 mg by mouth every 8 (eight) hours as needed (for pain).    Yes [provider]  amLODipine (NORVASC) 5 MG tablet Take 1 tablet (5 mg total) by mouth daily. 08/05/19 09/04/19  Dorie Rank, MD  traMADol (ULTRAM) 50 MG tablet Take 1 tablet (50 mg total) by  mouth every 6 (six) hours as needed for severe pain. Patient not taking: Reported on 08/05/2019 06/27/17   Duffy Bruce, MD    Family History History reviewed. No pertinent family history.  Social History Social History   Tobacco Use  . Smoking status: Former Research scientist (life sciences)  . Smokeless tobacco: Never Used  Substance Use Topics  . Alcohol use: Yes    Alcohol/week: 1.0 - 2.0 standard drinks    Types: 1 - 2 Glasses of wine per week    Comment: three times per week  . Drug use: No     Allergies   Penicillins   Review of Systems Review of Systems  All other systems reviewed and are negative.    Physical Exam Updated Vital Signs BP (!) 176/98   Pulse 72   Temp 98.6 F (37 C)   Resp 16   Ht 1.6 m (5\' 3" )   Wt 72.6 kg   SpO2 99%   BMI 28.34 kg/m   Physical Exam Vitals signs and nursing note reviewed.  Constitutional:      General: She is not in acute distress.    Appearance: She is well-developed.  HENT:     Head: Normocephalic.     Comments: Tenderness palpation posterior occiput    Right Ear: External ear normal.     Left Ear: External ear normal.  Eyes:     General: No scleral icterus.  Right eye: No discharge.        Left eye: No discharge.     Conjunctiva/sclera: Conjunctivae normal.  Neck:     Musculoskeletal: Neck supple.     Trachea: No tracheal deviation.  Cardiovascular:     Rate and Rhythm: Normal rate and regular rhythm.  Pulmonary:     Effort: Pulmonary effort is normal. No respiratory distress.     Breath sounds: Normal breath sounds. No stridor. No wheezing or rales.  Abdominal:     General: Bowel sounds are normal. There is no distension.     Palpations: Abdomen is soft.     Tenderness: There is no abdominal tenderness. There is no guarding or rebound.  Musculoskeletal:     Right shoulder: She exhibits no tenderness, no bony tenderness and no swelling.     Left shoulder: She exhibits no tenderness, no bony tenderness and no swelling.      Right wrist: She exhibits no tenderness, no bony tenderness and no swelling.     Left wrist: She exhibits no tenderness, no bony tenderness and no swelling.     Right hip: She exhibits normal range of motion, no tenderness, no bony tenderness and no swelling.     Left hip: She exhibits normal range of motion, no tenderness and no bony tenderness.     Right ankle: She exhibits no swelling. No tenderness.     Left ankle: She exhibits no swelling. No tenderness.     Cervical back: She exhibits tenderness. She exhibits no bony tenderness and no swelling.     Thoracic back: She exhibits no tenderness, no bony tenderness and no swelling.     Lumbar back: She exhibits no tenderness, no bony tenderness and no swelling.  Skin:    General: Skin is warm and dry.     Findings: No rash.  Neurological:     Mental Status: She is alert and oriented to person, place, and time.     Cranial Nerves: No cranial nerve deficit (no facial droop, extraocular movements intact, no slurred speech).     Sensory: No sensory deficit.     Motor: No abnormal muscle tone or seizure activity.     Coordination: Coordination normal.     Comments: No pronator drift bilateral upper extrem, able to hold both legs off bed for 5 seconds, sensation intact in all extremities, no visual field cuts, no left or right sided neglect, normal finger-nose exam bilaterally, no nystagmus noted       ED Treatments / Results  Labs (all labs ordered are listed, but only abnormal results are displayed) Labs Reviewed  CBC - Abnormal; Notable for the following components:      Result Value   MCV 102.7 (*)    MCH 34.8 (*)    All other components within normal limits  BASIC METABOLIC PANEL - Abnormal; Notable for the following components:   Glucose, Bld 102 (*)    BUN 5 (*)    All other components within normal limits    EKG EKG Interpretation  Date/Time:  Monday August 05 2019 21:30:59 EDT Ventricular Rate:  70 PR Interval:    QRS  Duration: 76 QT Interval:  386 QTC Calculation: 417 R Axis:   6 Text Interpretation:  Sinus rhythm LAE, consider biatrial enlargement Abnormal R-wave progression, early transition Left ventricular hypertrophy No significant change since last tracing Confirmed by Dorie Rank [phone]) on 08/05/2019 10:00:22 PM   Radiology Ct Head Wo Contrast  Result Date: 08/05/2019  CLINICAL DATA:  Fall, head injury EXAM: CT HEAD WITHOUT CONTRAST CT CERVICAL SPINE WITHOUT CONTRAST TECHNIQUE: Multidetector CT imaging of the head and cervical spine was performed following the standard protocol without intravenous contrast. Multiplanar CT image reconstructions of the cervical spine were also generated. COMPARISON:  None. FINDINGS: CT HEAD FINDINGS Brain: Ventricle size normal. Patchy white matter disease bilaterally consistent with ischemia. Negative for acute hemorrhage. No acute cortical infarct or mass. Vascular: Negative for hyperdense vessel Skull: Negative for skull fracture Sinuses/Orbits: Negative Other: None CT CERVICAL SPINE FINDINGS Alignment: Mild anterolisthesis C4-5.  Accentuated cervical kyphosis Skull base and vertebrae: Negative for fracture Soft tissues and spinal canal: Negative Disc levels: Disc degeneration and spurring at C5-6 and C6-7 causing foraminal encroachment bilaterally. Mild to moderate spinal stenosis at C6-7. Upper chest: Lung apices clear bilaterally. Other: None IMPRESSION: 1. Bilateral white matter disease most likely due to chronic ischemia. No acute intracranial abnormality 2. Cervical kyphosis and spondylosis.  Negative for fracture. Electronically Signed   By: Franchot Gallo M.D.   On: 08/05/2019 21:28   Ct Cervical Spine Wo Contrast  Result Date: 08/05/2019 CLINICAL DATA:  Fall, head injury EXAM: CT HEAD WITHOUT CONTRAST CT CERVICAL SPINE WITHOUT CONTRAST TECHNIQUE: Multidetector CT imaging of the head and cervical spine was performed following the standard protocol without intravenous  contrast. Multiplanar CT image reconstructions of the cervical spine were also generated. COMPARISON:  None. FINDINGS: CT HEAD FINDINGS Brain: Ventricle size normal. Patchy white matter disease bilaterally consistent with ischemia. Negative for acute hemorrhage. No acute cortical infarct or mass. Vascular: Negative for hyperdense vessel Skull: Negative for skull fracture Sinuses/Orbits: Negative Other: None CT CERVICAL SPINE FINDINGS Alignment: Mild anterolisthesis C4-5.  Accentuated cervical kyphosis Skull base and vertebrae: Negative for fracture Soft tissues and spinal canal: Negative Disc levels: Disc degeneration and spurring at C5-6 and C6-7 causing foraminal encroachment bilaterally. Mild to moderate spinal stenosis at C6-7. Upper chest: Lung apices clear bilaterally. Other: None IMPRESSION: 1. Bilateral white matter disease most likely due to chronic ischemia. No acute intracranial abnormality 2. Cervical kyphosis and spondylosis.  Negative for fracture. Electronically Signed   By: Franchot Gallo M.D.   On: 08/05/2019 21:28    Procedures Procedures (including critical care time)  Medications Ordered in ED Medications  amLODipine (NORVASC) tablet 5 mg (has no administration in time range)  acetaminophen (TYLENOL) tablet 650 mg (has no administration in time range)     Initial Impression / Assessment and Plan / ED Course  I have reviewed the triage vital signs and the nursing notes.  Pertinent labs & imaging results that were available during my care of the patient were reviewed by me and considered in my medical decision making (see chart for details).    Patient presented to the emergency room for evaluation after head injury.  No significant abnormalities noted on her laboratory tests.  No focal neurologic deficits on exam .  CT scan of her head and C-spine without acute injuries.  Patient CT scan does suggest chronic ischemia and the patient was noted to be hypertensive.  Patient is not  currently on any medications for her blood pressure.  She did mention she did not go to her primary care doctor's appointment this year because of the pandemic.  She does have a family history of hypertension.  I suspect she has developed hypertension.  I will start her on Norvasc and recommended she follow-up with a primary care doctor to recheck her blood pressure.  I  suspect her dizziness is related to concussion symptoms.     Final Clinical Impressions(s) / ED Diagnoses   Final diagnoses:  Hypertension, unspecified type  Concussion with loss of consciousness, initial encounter    ED Discharge Orders         Ordered    amLODipine (NORVASC) 5 MG tablet  Daily     08/05/19 2233           Dorie Rank, MD 08/05/19 2235

## 2020-04-15 ENCOUNTER — Other Ambulatory Visit: Payer: Self-pay | Admitting: Obstetrics and Gynecology

## 2020-04-15 DIAGNOSIS — N644 Mastodynia: Secondary | ICD-10-CM

## 2020-05-05 ENCOUNTER — Other Ambulatory Visit: Payer: Self-pay

## 2020-05-05 ENCOUNTER — Ambulatory Visit
Admission: RE | Admit: 2020-05-05 | Discharge: 2020-05-05 | Disposition: A | Discharge status: Home / Self Care | Payer: Medicare Other | Source: Ambulatory Visit | Attending: Obstetrics and Gynecology | Admitting: Obstetrics and Gynecology

## 2020-05-05 DIAGNOSIS — N644 Mastodynia: Secondary | ICD-10-CM

## 2020-05-05 DIAGNOSIS — R922 Inconclusive mammogram: Secondary | ICD-10-CM | POA: Diagnosis not present

## 2020-06-26 ENCOUNTER — Other Ambulatory Visit: Payer: Self-pay | Admitting: General Practice

## 2020-06-26 DIAGNOSIS — Z780 Asymptomatic menopausal state: Secondary | ICD-10-CM

## 2020-08-12 ENCOUNTER — Ambulatory Visit: Payer: Medicare Other | Admitting: Podiatry

## 2020-08-13 ENCOUNTER — Ambulatory Visit
Admission: RE | Admit: 2020-08-13 | Discharge: 2020-08-13 | Disposition: A | Discharge status: Home / Self Care | Payer: Medicare Other | Source: Ambulatory Visit | Attending: General Practice | Admitting: General Practice

## 2020-08-13 DIAGNOSIS — Z780 Asymptomatic menopausal state: Secondary | ICD-10-CM

## 2020-11-03 ENCOUNTER — Other Ambulatory Visit (HOSPITAL_COMMUNITY): Payer: Self-pay | Admitting: General Practice

## 2020-11-03 DIAGNOSIS — I509 Heart failure, unspecified: Secondary | ICD-10-CM

## 2020-11-04 ENCOUNTER — Encounter: Payer: Self-pay | Admitting: Podiatry

## 2020-11-04 ENCOUNTER — Ambulatory Visit: Payer: Medicare Other | Admitting: Podiatry

## 2020-11-04 ENCOUNTER — Other Ambulatory Visit: Payer: Self-pay

## 2020-11-04 DIAGNOSIS — M79675 Pain in left toe(s): Secondary | ICD-10-CM

## 2020-11-04 DIAGNOSIS — B351 Tinea unguium: Secondary | ICD-10-CM

## 2020-11-04 DIAGNOSIS — M79674 Pain in right toe(s): Secondary | ICD-10-CM | POA: Diagnosis not present

## 2020-11-08 NOTE — Progress Notes (Signed)
Subjective:  Patient ID: Megan Haney, female    DOB: [birth year],  MRN: [id number]  69 y.o. female presents with painful thick toenails that are difficult to trim. Pain interferes with ambulation. Aggravating factors include wearing enclosed shoe gear. Pain is relieved with periodic professional debridement.Marland Kitchen    PCP: Andree Moro, DO and last visit was: 11/03/2020.  Review of Systems: Negative except as noted in the HPI.  History reviewed. No pertinent past medical history. Past Surgical History:  Procedure Laterality Date   DILATATION & CURETTAGE/HYSTEROSCOPY WITH TRUECLEAR N/A 10/04/2013   Procedure: DILATATION & CURETTAGE/HYSTEROSCOPY WITH TRUECLEAR;  Surgeon: Shon Millet II, MD;  Location: [facility];  Service: Gynecology;  Laterality: N/A;   ROTATOR CUFF REPAIR Right 2012   Patient Active Problem List   Diagnosis Date Noted   UNSPEC DISORDERS [place] SHOULDER REGION 10/08/2009   COMPLETE RUPTURE OF ROTATOR CUFF 10/08/2009   BICEPS TENDON RUPTURE, RIGHT 10/08/2009    Current Outpatient Medications:    amLODipine (NORVASC) 5 MG tablet, Take 1 tablet (5 mg total) by mouth daily., Disp: 30 tablet, Rfl: 0   folic acid (FOLVITE) 1 MG tablet, Take 1 mg by mouth daily., Disp: , Rfl:    ibuprofen (ADVIL,MOTRIN) 200 MG tablet, Take 600 mg by mouth every 8 (eight) hours as needed (for pain). , Disp: , Rfl:    losartan-hydrochlorothiazide (HYZAAR) 100-12.5 MG tablet, Take 1 tablet by mouth daily., Disp: , Rfl:    losartan-hydrochlorothiazide (HYZAAR) 100-25 MG tablet, Take 1 tablet by mouth daily., Disp: , Rfl:    metFORMIN (GLUCOPHAGE-XR) 500 MG 24 hr tablet, Take 500 mg by mouth daily., Disp: , Rfl:    naltrexone (DEPADE) 50 MG tablet, Take 25 mg by mouth every morning., Disp: , Rfl:    traMADol (ULTRAM) 50 MG tablet, Take 1 tablet (50 mg total) by mouth every 6 (six) hours as needed for severe pain. (Patient not taking: Reported on 08/05/2019), Disp: 16 tablet, Rfl: 0    Vitamin D, Ergocalciferol, (DRISDOL) 1.25 MG (50000 UNIT) CAPS capsule, Take by mouth., Disp: , Rfl:  Allergies  Allergen Reactions   Penicillins Swelling    Did it involve swelling of the face/tongue/throat, SOB, or low BP? Yes Did it involve sudden or severe rash/hives, skin peeling, or any reaction on the inside of your mouth or nose? No Did you need to seek medical attention at a hospital or doctor's office? No When did it last happen?Close to 50 years ago(Face became swollen and red) If all above answers are NO, may proceed with cephalosporin use.    Social History   Tobacco Use  Smoking Status Former Smoker  Smokeless Tobacco Never Used    Objective:  There were no vitals filed for this visit. Constitutional Patient is a pleasant 69 y.o. African American female in NAD. AAO x 3.  Vascular Capillary refill time to digits immediate b/l. Palpable pedal pulses b/l LE. Pedal hair sparse. Lower extremity skin temperature gradient within normal limits. No cyanosis or clubbing noted.  Neurologic Normal speech. Protective sensation intact 5/5 intact bilaterally with 10g monofilament b/l. Vibratory sensation intact b/l.  Dermatologic Pedal skin with normal turgor, texture and tone bilaterally. No open wounds bilaterally. No interdigital macerations bilaterally. Toenails 1-5 b/l elongated, discolored, dystrophic, thickened, crumbly with subungual debris and tenderness to dorsal palpation.  Orthopedic: Normal muscle strength 5/5 to all lower extremity muscle groups bilaterally. No pain crepitus or joint limitation noted with ROM b/l. No gross bony deformities bilaterally. Patient ambulates  independent of any assistive aids.   No flowsheet data found.     Assessment:   1. Pain due to onychomycosis of toenails of both feet    Plan:  Patient was evaluated and treated and all questions answered.  Onychomycosis with pain -Nails palliatively debridement as below. -Educated on  self-care  Procedure: Nail Debridement Rationale: Pain Type of Debridement: manual, sharp debridement. Instrumentation: Nail nipper, rotary burr. Number of Nails: 10  -Examined patient. -Patient to continue soft, supportive shoe gear daily. -Toenails 1-5 b/l were debrided in length and girth with sterile nail nippers and dremel without iatrogenic bleeding.  -Patient to report any pedal injuries to medical professional immediately. -Patient/POA to call should there be question/concern in the interim.  Return in about 3 months (around 02/04/2021) for painful mycotic toenails.  Marzetta Board, DPM

## 2020-11-27 ENCOUNTER — Other Ambulatory Visit (HOSPITAL_COMMUNITY): Payer: Medicare Other

## 2020-12-16 ENCOUNTER — Other Ambulatory Visit: Payer: Self-pay

## 2020-12-16 ENCOUNTER — Ambulatory Visit (HOSPITAL_COMMUNITY): Payer: Medicare Other | Attending: Cardiology

## 2020-12-16 DIAGNOSIS — I509 Heart failure, unspecified: Secondary | ICD-10-CM | POA: Insufficient documentation

## 2020-12-16 LAB — ECHOCARDIOGRAM COMPLETE
Area-P 1/2: 3.27 cm2
S' Lateral: 1.9 cm

## 2021-03-05 ENCOUNTER — Ambulatory Visit: Payer: Medicare Other | Admitting: Podiatry

## 2021-03-30 ENCOUNTER — Other Ambulatory Visit: Payer: Self-pay

## 2021-03-30 ENCOUNTER — Encounter: Payer: Self-pay | Admitting: Podiatry

## 2021-03-30 ENCOUNTER — Ambulatory Visit: Payer: Medicare Other | Admitting: Podiatry

## 2021-03-30 DIAGNOSIS — M79674 Pain in right toe(s): Secondary | ICD-10-CM | POA: Diagnosis not present

## 2021-03-30 DIAGNOSIS — M79675 Pain in left toe(s): Secondary | ICD-10-CM | POA: Diagnosis not present

## 2021-03-30 DIAGNOSIS — B351 Tinea unguium: Secondary | ICD-10-CM | POA: Diagnosis not present

## 2021-03-30 NOTE — Progress Notes (Signed)
This patient returns to the office for evaluation and treatment of long thick painful nails .  This patient is unable to trim her own nails since the patient cannot reach her feet.  Patient says the nails are painful walking and wearing his shoes.  She returns for preventive foot care services.  General Appearance  Alert, conversant and in no acute stress.  Vascular  Dorsalis pedis and posterior tibial  pulses are palpable  bilaterally.  Capillary return is within normal limits  bilaterally. Temperature is within normal limits  bilaterally.  Neurologic  Senn-Weinstein monofilament wire test within normal limits  bilaterally. Muscle power within normal limits bilaterally.  Nails Thick disfigured discolored nails with subungual debris  from hallux to fifth toes bilaterally. No evidence of bacterial infection or drainage bilaterally.  Orthopedic  No limitations of motion  feet .  No crepitus or effusions noted.  No bony pathology or digital deformities noted.  Skin  normotropic skin with no porokeratosis noted bilaterally.  No signs of infections or ulcers noted.     Onychomycosis  Pain in toes right foot  Pain in toes left foot  Debridement  of nails  1-5  B/L with a nail nipper.  Nails were then filed using a dremel tool with no incidents.    RTC  4 months    Gardiner Barefoot DPM

## 2021-06-01 ENCOUNTER — Other Ambulatory Visit (HOSPITAL_COMMUNITY): Payer: Self-pay

## 2021-06-01 MED ORDER — CARESTART COVID-19 HOME TEST VI KIT
PACK | 0 refills | Status: AC
  Filled 2021-06-01: qty 2, 2d supply, fill #0

## 2021-06-17 ENCOUNTER — Other Ambulatory Visit: Payer: Self-pay | Admitting: Obstetrics and Gynecology

## 2021-06-17 DIAGNOSIS — Z1231 Encounter for screening mammogram for malignant neoplasm of breast: Secondary | ICD-10-CM

## 2021-06-18 ENCOUNTER — Other Ambulatory Visit: Payer: Self-pay

## 2021-06-18 ENCOUNTER — Other Ambulatory Visit (HOSPITAL_COMMUNITY): Payer: Self-pay | Admitting: Family Medicine

## 2021-06-18 ENCOUNTER — Ambulatory Visit
Admission: RE | Admit: 2021-06-18 | Discharge: 2021-06-18 | Disposition: A | Discharge status: Home / Self Care | Payer: Medicare Other | Source: Ambulatory Visit | Attending: Obstetrics and Gynecology | Admitting: Obstetrics and Gynecology

## 2021-06-18 DIAGNOSIS — R011 Cardiac murmur, unspecified: Secondary | ICD-10-CM

## 2021-06-18 DIAGNOSIS — Z1231 Encounter for screening mammogram for malignant neoplasm of breast: Secondary | ICD-10-CM

## 2021-07-22 ENCOUNTER — Ambulatory Visit (HOSPITAL_COMMUNITY): Payer: Medicare Other | Attending: Cardiology

## 2021-07-22 ENCOUNTER — Other Ambulatory Visit: Payer: Self-pay

## 2021-07-22 DIAGNOSIS — R011 Cardiac murmur, unspecified: Secondary | ICD-10-CM | POA: Diagnosis present

## 2021-07-22 LAB — ECHOCARDIOGRAM COMPLETE
Area-P 1/2: 3.17 cm2
S' Lateral: 2.4 cm

## 2021-08-04 ENCOUNTER — Encounter: Payer: Self-pay | Admitting: Podiatry

## 2021-08-04 ENCOUNTER — Other Ambulatory Visit: Payer: Self-pay

## 2021-08-04 ENCOUNTER — Ambulatory Visit (INDEPENDENT_AMBULATORY_CARE_PROVIDER_SITE_OTHER): Payer: Medicare Other | Admitting: Podiatry

## 2021-08-04 DIAGNOSIS — M79674 Pain in right toe(s): Secondary | ICD-10-CM | POA: Diagnosis not present

## 2021-08-04 DIAGNOSIS — B351 Tinea unguium: Secondary | ICD-10-CM

## 2021-08-04 DIAGNOSIS — M79675 Pain in left toe(s): Secondary | ICD-10-CM

## 2021-08-04 NOTE — Progress Notes (Signed)
This patient returns to the office for evaluation and treatment of long thick painful nails .  This patient is unable to trim her own nails since the patient cannot reach her feet.  Patient says the nails are painful walking and wearing his shoes.  She returns for preventive foot care services.  General Appearance  Alert, conversant and in no acute stress.  Vascular  Dorsalis pedis and posterior tibial  pulses are palpable  bilaterally.  Capillary return is within normal limits  bilaterally. Temperature is within normal limits  bilaterally.  Neurologic  Senn-Weinstein monofilament wire test within normal limits  bilaterally. Muscle power within normal limits bilaterally.  Nails Thick disfigured discolored nails with subungual debris  from hallux to fifth toes bilaterally. No evidence of bacterial infection or drainage bilaterally.  Orthopedic  No limitations of motion  feet .  No crepitus or effusions noted.  No bony pathology or digital deformities noted.  Skin  normotropic skin with no porokeratosis noted bilaterally.  No signs of infections or ulcers noted.     Onychomycosis  Pain in toes right foot  Pain in toes left foot  Debridement  of nails  1-5  B/L with a nail nipper.  Nails were then filed using a dremel tool with no incidents.    RTC  4 months    Gardiner Barefoot DPM

## 2021-12-01 ENCOUNTER — Ambulatory Visit: Payer: Medicare Other | Admitting: Podiatry

## 2022-05-25 ENCOUNTER — Ambulatory Visit: Payer: Medicare Other | Admitting: Podiatry

## 2022-06-08 ENCOUNTER — Encounter: Payer: Self-pay | Admitting: Podiatry

## 2022-06-08 ENCOUNTER — Ambulatory Visit (INDEPENDENT_AMBULATORY_CARE_PROVIDER_SITE_OTHER): Payer: Medicare Other | Admitting: Podiatry

## 2022-06-08 DIAGNOSIS — M79675 Pain in left toe(s): Secondary | ICD-10-CM | POA: Diagnosis not present

## 2022-06-08 DIAGNOSIS — B351 Tinea unguium: Secondary | ICD-10-CM | POA: Diagnosis not present

## 2022-06-08 DIAGNOSIS — M79674 Pain in right toe(s): Secondary | ICD-10-CM

## 2022-06-08 NOTE — Progress Notes (Signed)
This patient returns to the office for evaluation and treatment of long thick painful nails .  This patient is unable to trim her own nails since the patient cannot reach her feet.  Patient says the nails are painful walking and wearing his shoes.  She returns for preventive foot care services.  General Appearance  Alert, conversant and in no acute stress.  Vascular  Dorsalis pedis and posterior tibial  pulses are palpable  bilaterally.  Capillary return is within normal limits  bilaterally. Temperature is within normal limits  bilaterally.  Neurologic  Senn-Weinstein monofilament wire test within normal limits  bilaterally. Muscle power within normal limits bilaterally.  Nails Thick disfigured discolored nails with subungual debris  from hallux to fifth toes bilaterally. No evidence of bacterial infection or drainage bilaterally.  Orthopedic  No limitations of motion  feet .  No crepitus or effusions noted.  No bony pathology or digital deformities noted. Hammer toe second toe right foot.  Skin  normotropic skin with no porokeratosis noted bilaterally.  No signs of infections or ulcers noted.     Onychomycosis  Pain in toes right foot  Pain in toes left foot  Debridement  of nails  1-5  B/L with a nail nipper.  Nails were then filed using a dremel tool with no incidents.    RTC  4 months    Wanya Bangura DPM  

## 2022-08-25 ENCOUNTER — Other Ambulatory Visit: Payer: Self-pay | Admitting: General Practice

## 2022-08-25 DIAGNOSIS — Z1231 Encounter for screening mammogram for malignant neoplasm of breast: Secondary | ICD-10-CM

## 2022-08-31 ENCOUNTER — Ambulatory Visit: Payer: Medicare Other

## 2022-09-02 ENCOUNTER — Ambulatory Visit: Payer: Medicare Other

## 2022-09-15 ENCOUNTER — Ambulatory Visit
Admission: RE | Admit: 2022-09-15 | Discharge: 2022-09-15 | Disposition: A | Discharge status: Home / Self Care | Payer: Medicare Other | Source: Ambulatory Visit | Attending: General Practice | Admitting: General Practice

## 2022-09-15 DIAGNOSIS — Z1231 Encounter for screening mammogram for malignant neoplasm of breast: Secondary | ICD-10-CM

## 2022-10-10 ENCOUNTER — Encounter: Payer: Self-pay | Admitting: Podiatry

## 2022-10-10 ENCOUNTER — Ambulatory Visit: Payer: Medicare Other | Admitting: Podiatry

## 2022-10-10 DIAGNOSIS — B351 Tinea unguium: Secondary | ICD-10-CM

## 2022-10-10 DIAGNOSIS — M79674 Pain in right toe(s): Secondary | ICD-10-CM

## 2022-10-10 DIAGNOSIS — M79675 Pain in left toe(s): Secondary | ICD-10-CM | POA: Diagnosis not present

## 2022-10-10 NOTE — Progress Notes (Signed)
This patient returns to the office for evaluation and treatment of long thick painful nails .  This patient is unable to trim her own nails since the patient cannot reach her feet.  Patient says the nails are painful walking and wearing his shoes.  She returns for preventive foot care services.  General Appearance  Alert, conversant and in no acute stress.  Vascular  Dorsalis pedis and posterior tibial  pulses are palpable  bilaterally.  Capillary return is within normal limits  bilaterally. Temperature is within normal limits  bilaterally.  Neurologic  Senn-Weinstein monofilament wire test within normal limits  bilaterally. Muscle power within normal limits bilaterally.  Nails Thick disfigured discolored nails with subungual debris  from hallux to fifth toes bilaterally. No evidence of bacterial infection or drainage bilaterally.  Orthopedic  No limitations of motion  feet .  No crepitus or effusions noted.  No bony pathology or digital deformities noted. Hammer toe second toe right foot.  Skin  normotropic skin with no porokeratosis noted bilaterally.  No signs of infections or ulcers noted.     Onychomycosis  Pain in toes right foot  Pain in toes left foot  Debridement  of nails  1-5  B/L with a nail nipper.  Nails were then filed using a dremel tool with no incidents.    RTC  4 months    Gardiner Barefoot DPM

## 2023-02-13 ENCOUNTER — Ambulatory Visit: Payer: Medicare Other | Admitting: Podiatry

## 2023-02-19 IMAGING — MG MM DIGITAL SCREENING BILAT W/ TOMO AND CAD
6 of 12 series · 6 of 36 positions shown · non-contrast
Comparison: Previous exam(s).

CLINICAL DATA: Screening.

EXAM:
DIGITAL SCREENING BILATERAL MAMMOGRAM WITH TOMOSYNTHESIS AND CAD
TECHNIQUE: Bilateral screening digital craniocaudal and mediolateral oblique
mammograms were obtained. Bilateral screening digital breast
tomosynthesis was performed. The images were evaluated with
computer-aided detection.

[R MLO synth-2D (1 of 2)]
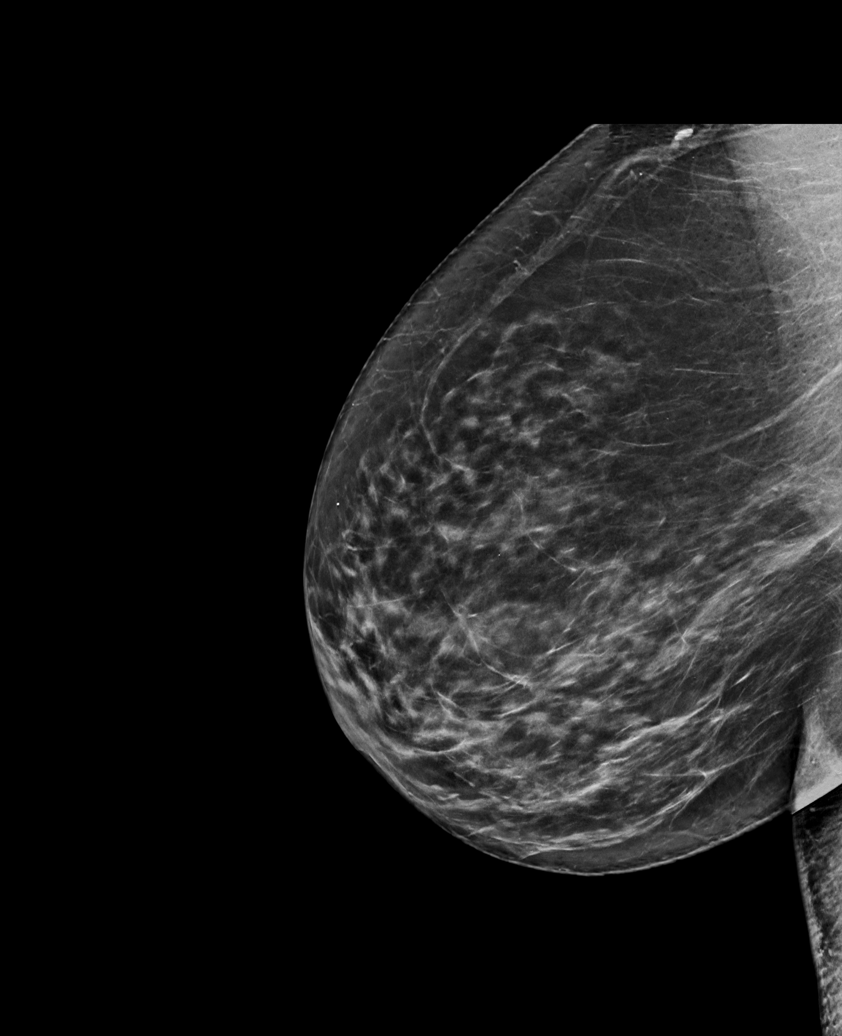

[L CC synth-2D (1 of 2)]
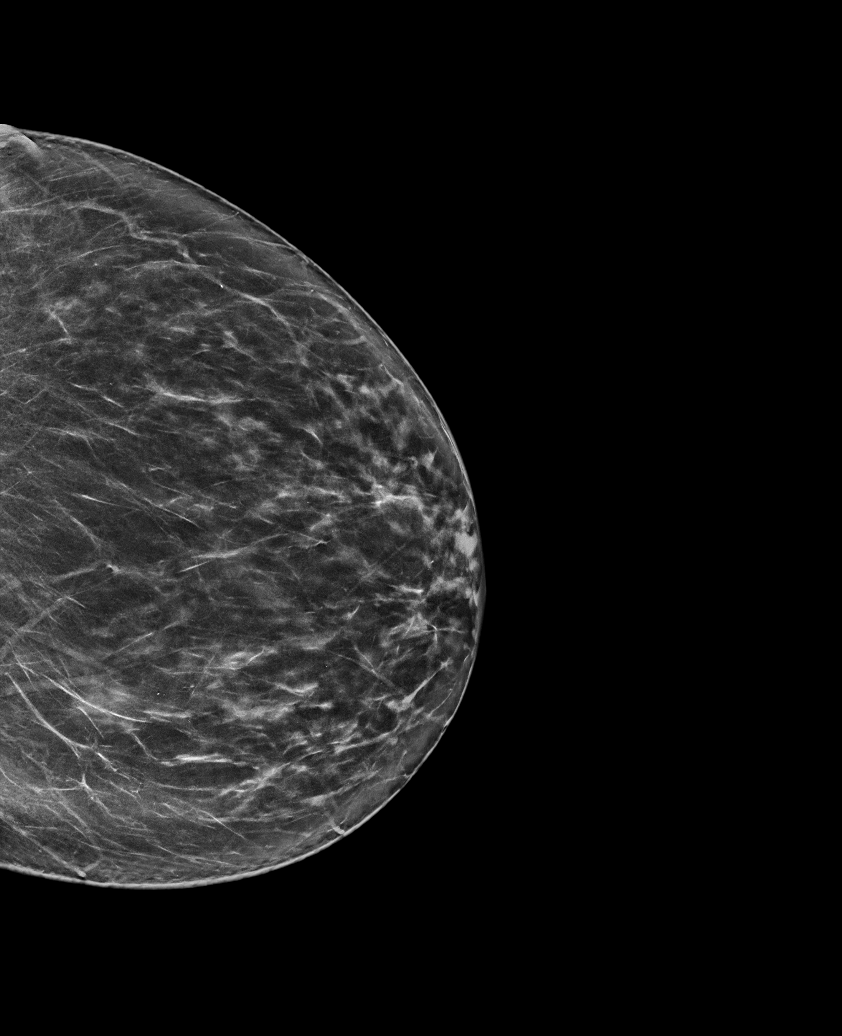

[L CC synth-2D (2 of 2)]
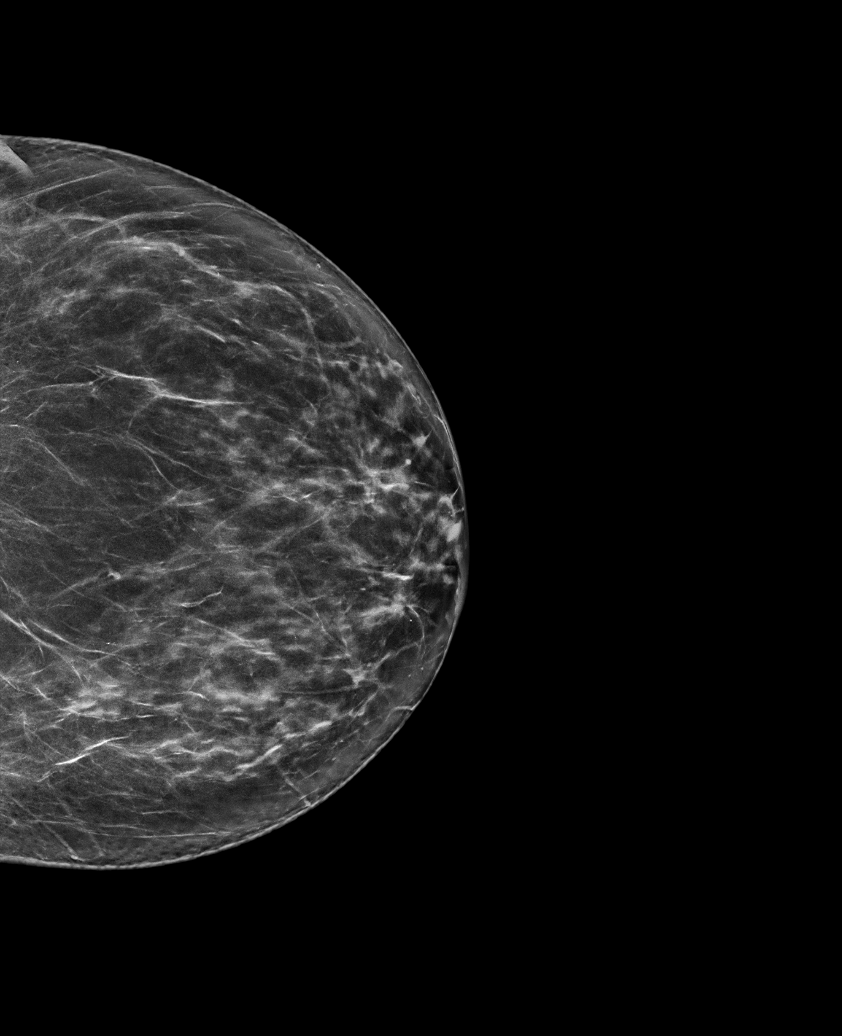

[L MLO synth-2D]
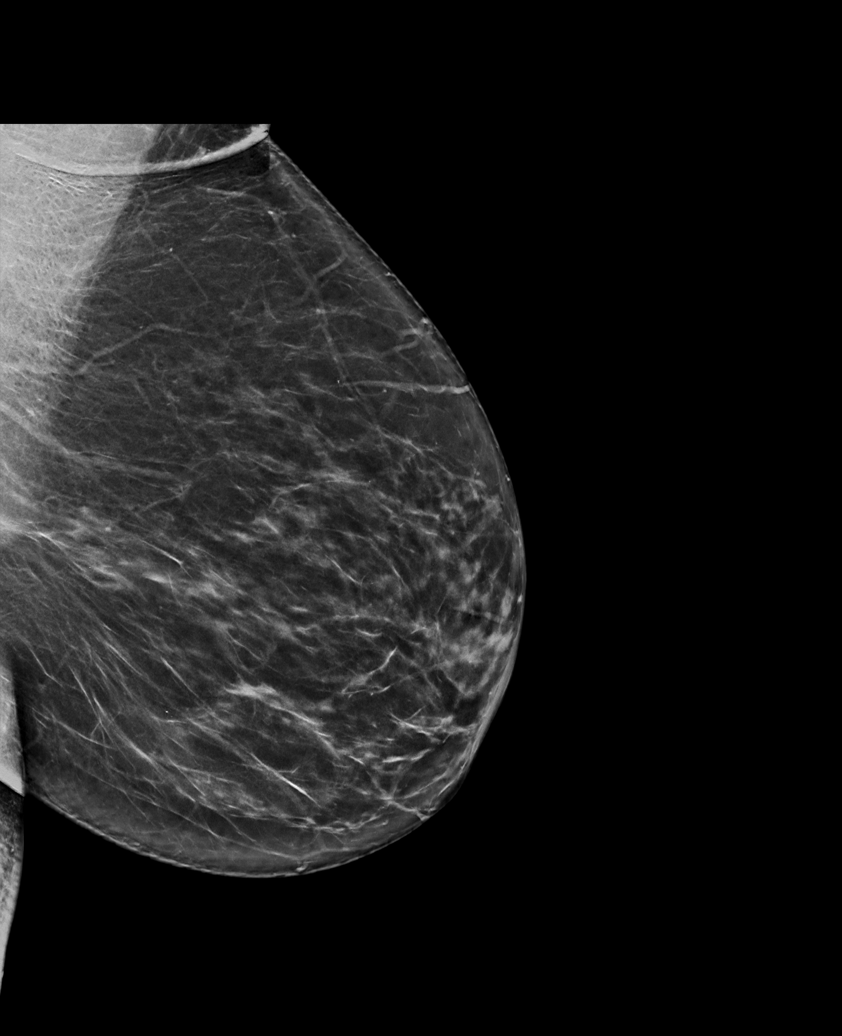

[R MLO synth-2D (2 of 2)]
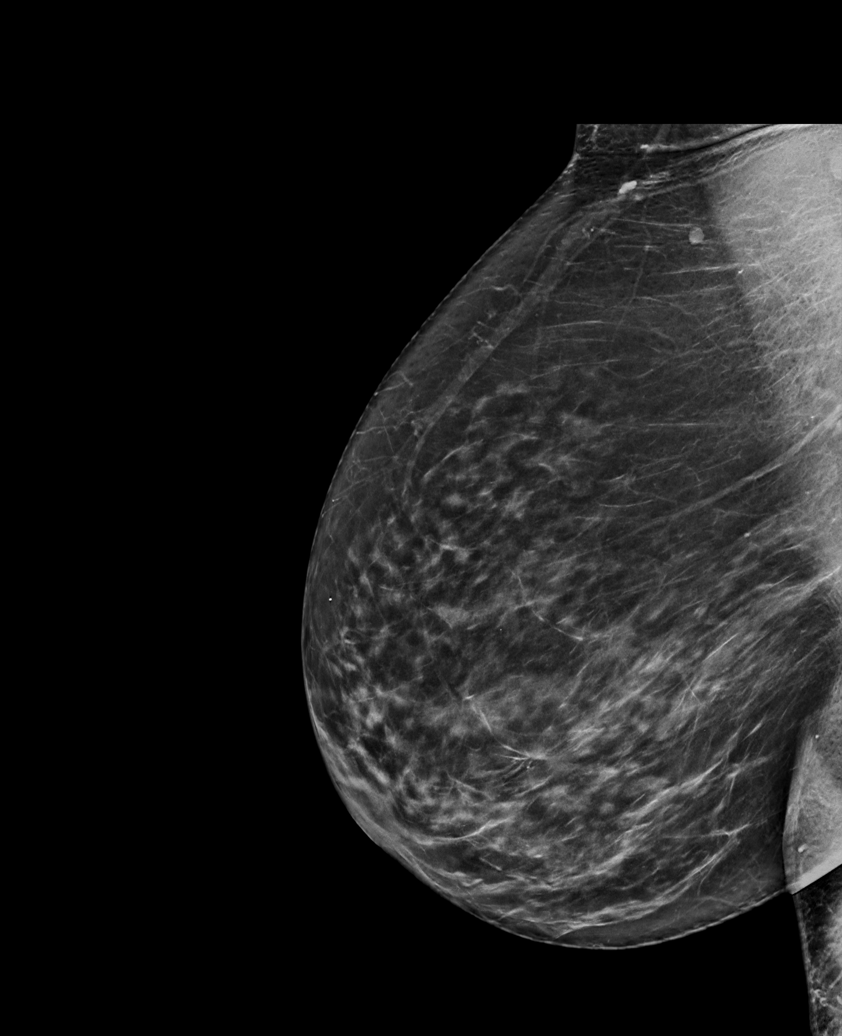

[R CC synth-2D]
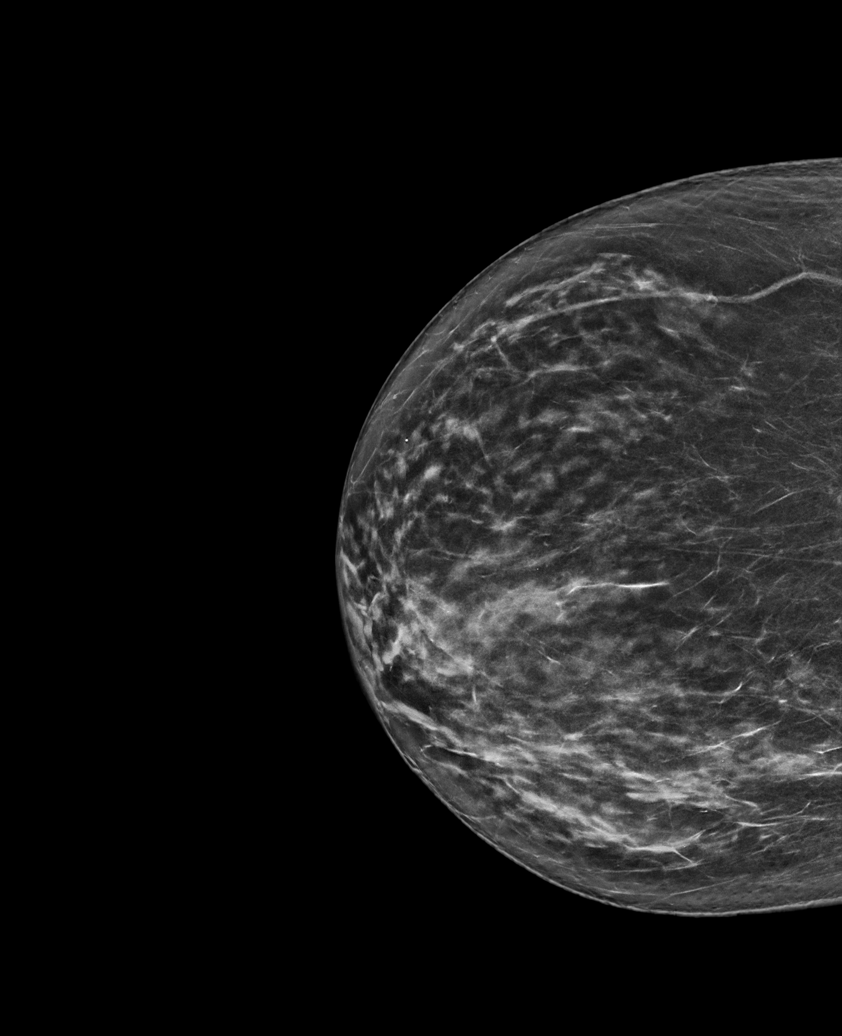

[6 of 36 positions shown; findings below may reference images not displayed]

ACR Breast Density Category c: The breast tissue is heterogeneously
dense, which may obscure small masses.
FINDINGS: There are no findings suspicious for malignancy.
IMPRESSION: No mammographic evidence of malignancy. A result letter of this
screening mammogram will be mailed directly to the patient.

RECOMMENDATION:
Screening mammogram in one year. (Code:Q3-W-BC3)

BI-RADS CATEGORY  1: Negative.

## 2023-02-28 ENCOUNTER — Other Ambulatory Visit (HOSPITAL_COMMUNITY): Payer: Self-pay

## 2023-04-12 ENCOUNTER — Ambulatory Visit: Payer: Medicare Other | Admitting: Podiatry

## 2023-08-03 ENCOUNTER — Other Ambulatory Visit: Payer: Self-pay | Admitting: General Surgery

## 2023-08-03 DIAGNOSIS — Z0000 Encounter for general adult medical examination without abnormal findings: Secondary | ICD-10-CM

## 2023-08-31 DIAGNOSIS — Z1231 Encounter for screening mammogram for malignant neoplasm of breast: Secondary | ICD-10-CM

## 2023-09-06 ENCOUNTER — Encounter: Payer: Self-pay | Admitting: Podiatry

## 2023-09-06 ENCOUNTER — Ambulatory Visit: Payer: Medicare Other | Admitting: Podiatry

## 2023-09-06 DIAGNOSIS — M79675 Pain in left toe(s): Secondary | ICD-10-CM | POA: Diagnosis not present

## 2023-09-06 DIAGNOSIS — M79674 Pain in right toe(s): Secondary | ICD-10-CM | POA: Diagnosis not present

## 2023-09-06 DIAGNOSIS — B351 Tinea unguium: Secondary | ICD-10-CM

## 2023-09-06 NOTE — Progress Notes (Signed)
This patient returns to the office for evaluation and treatment of long thick painful nails .  This patient is unable to trim her own nails since the patient cannot reach her feet.  Patient says the nails are painful walking and wearing his shoes.  She returns for preventive foot care services.  General Appearance  Alert, conversant and in no acute stress.  Vascular  Dorsalis pedis and posterior tibial  pulses are palpable  bilaterally.  Capillary return is within normal limits  bilaterally. Temperature is within normal limits  bilaterally.  Neurologic  Senn-Weinstein monofilament wire test within normal limits  bilaterally. Muscle power within normal limits bilaterally.  Nails Thick disfigured discolored nails with subungual debris  from hallux to fifth toes bilaterally. No evidence of bacterial infection or drainage bilaterally.  Orthopedic  No limitations of motion  feet .  No crepitus or effusions noted.  No bony pathology or digital deformities noted. Hammer toe second toe right foot.  Skin  normotropic skin with no porokeratosis noted bilaterally.  No signs of infections or ulcers noted.     Onychomycosis  Pain in toes right foot  Pain in toes left foot  Debridement  of nails  1-5  B/L with a nail nipper.  Nails were then filed using a dremel tool with no incidents.    RTC  4 months    Gregory Mayer DPM  

## 2023-09-18 ENCOUNTER — Ambulatory Visit
Admission: RE | Admit: 2023-09-18 | Discharge: 2023-09-18 | Disposition: A | Discharge status: Home / Self Care | Payer: Medicare Other | Source: Ambulatory Visit | Attending: General Surgery | Admitting: General Surgery

## 2023-09-18 DIAGNOSIS — Z0000 Encounter for general adult medical examination without abnormal findings: Secondary | ICD-10-CM

## 2024-01-22 ENCOUNTER — Ambulatory Visit: Payer: Medicare Other | Admitting: Podiatry

## 2024-01-22 ENCOUNTER — Encounter: Payer: Self-pay | Admitting: Podiatry

## 2024-01-22 DIAGNOSIS — B351 Tinea unguium: Secondary | ICD-10-CM | POA: Diagnosis not present

## 2024-01-22 DIAGNOSIS — M79675 Pain in left toe(s): Secondary | ICD-10-CM

## 2024-01-22 DIAGNOSIS — M79674 Pain in right toe(s): Secondary | ICD-10-CM

## 2024-01-22 NOTE — Progress Notes (Signed)
This patient returns to the office for evaluation and treatment of long thick painful nails .  This patient is unable to trim her own nails since the patient cannot reach her feet.  Patient says the nails are painful walking and wearing his shoes.  She returns for preventive foot care services.  General Appearance  Alert, conversant and in no acute stress.  Vascular  Dorsalis pedis and posterior tibial  pulses are palpable  bilaterally.  Capillary return is within normal limits  bilaterally. Temperature is within normal limits  bilaterally.  Neurologic  Senn-Weinstein monofilament wire test within normal limits  bilaterally. Muscle power within normal limits bilaterally.  Nails Thick disfigured discolored nails with subungual debris  from hallux to fifth toes bilaterally. No evidence of bacterial infection or drainage bilaterally.  Orthopedic  No limitations of motion  feet .  No crepitus or effusions noted.  No bony pathology or digital deformities noted. 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  Skin  normotropic skin with no porokeratosis noted bilaterally.  No signs of infections or ulcers noted.     Onychomycosis  Pain in toes right foot  Pain in toes left foot  Debridement  of nails  1-5  B/L with a nail nipper.  Nails were then filed using a dremel tool with no incidents.    RTC  4 months    Helane Gunther DPM

## 2024-05-02 ENCOUNTER — Ambulatory Visit: Payer: Medicare Other | Attending: Cardiology | Admitting: Cardiology

## 2024-05-02 ENCOUNTER — Encounter: Payer: Self-pay | Admitting: Cardiology

## 2024-05-02 VITALS — BP 135/78 | HR 66 | Ht 63.0 in | Wt 180.0 lb

## 2024-05-02 DIAGNOSIS — Z136 Encounter for screening for cardiovascular disorders: Secondary | ICD-10-CM

## 2024-05-02 DIAGNOSIS — I509 Heart failure, unspecified: Secondary | ICD-10-CM

## 2024-05-02 NOTE — Progress Notes (Signed)
 Cardiology Office Note:  .   Date:  05/02/2024  ID:  Megan Haney, DOB [birth year], MRN [id number] PCP: Forestine Igo, DO  [facility] Providers Cardiologist:  Dorothye Gathers, MD     History of Present Illness: Megan Haney is a 73 y.o. female Discussed the use of AI scribe software for clinical note transcription with the patient, who gave verbal consent to proceed.  History of Present Illness Megan Haney is a 73 year old female with hypertension and hyperlipidemia who presents for evaluation of asymptomatic heart failure. She was referred by her primary doctor for evaluation of asymptomatic heart failure.  She has a history of hypertension and is currently taking losartan hydrochlorothiazide 100 mg/25 mg. Her blood pressure management is a focus due to echocardiogram findings. No chest pain or shortness of breath.  An echocardiogram performed on September 06, 2023, showed normal left ventricular systolic function with an ejection fraction of 55-60%, mild concentric left ventricular hypertrophy, mild aortic valve leaflet thickening, mild mitral regurgitation, and grade one diastolic dysfunction. She has not experienced any heart failure symptoms such as shortness of breath or fluid retention.  A recent EKG showed changes compared to a previous one, prompting her primary doctor to refer her for cardiology evaluation. She remains asymptomatic and has not experienced any heart failure symptoms.  Her family history is significant for her brother who died of a heart attack at age 47. She is concerned about her cholesterol levels, which have been increasing over the past few years. She is currently taking pravastatin 10 mg for hyperlipidemia. Her recent LDL cholesterol was 74 mg/dL, and she reports a total cholesterol of 195 mg/dL from a recent blood donation.  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 She is active  and reports no limitations in her activities.      ROS: No CP, no SOB  Studies Reviewed: Aaron Aas    EKG Interpretation Date/Time:  Thursday May 02 2024 10:26:07 EDT Ventricular Rate:  66 PR Interval:  156 QRS Duration:  70 QT Interval:  380 QTC Calculation: 398 R Axis:   -1  Text Interpretation: Normal sinus rhythm Minimal voltage criteria for LVH, may be normal variant ( R in aVL ) ABNORMAL R WAVE PROGRESSION When compared with ECG of 05-Aug-2019 21:30, No significant change since last tracing Confirmed by Dorothye Gathers ([id number]) on 05/02/2024 10:38:30 AM    Results LABS LDL: 74 ALT: 19 Creatinine: 0.82 Hemoglobin: 14.2  DIAGNOSTIC Echocardiogram: Normal LV systolic function with EF of 55-60%, mild concentric LVH, mild aortic valve leaflet thickening, mild mitral regurgitation, grade one diastolic dysfunction (09/06/2023) EKG: Poor R wave progression (05/02/2024)  Risk Assessment/Calculations:            Physical Exam:   VS:  BP 135/78   Pulse 66   Ht 5\' 3"  (1.6 m)   Wt 180 lb (81.6 kg)   SpO2 94%   BMI 31.89 kg/m    Wt Readings from Last 3 Encounters:  05/02/24 180 lb (81.6 kg)  08/05/19 160 lb (72.6 kg)  06/27/17 157 lb (71.2 kg)    GEN: Well nourished, well developed in no acute distress NECK: No JVD; No carotid bruits CARDIAC: RRR, no murmurs, no rubs, no gallops RESPIRATORY:  Clear to auscultation without rales, wheezing or rhonchi  ABDOMEN: Soft, non-tender, non-distended EXTREMITIES:  No edema; No deformity   ASSESSMENT AND  PLAN: .    Assessment and Plan Assessment & Plan Mild diastolic dysfunction Echocardiogram reveals mild diastolic dysfunction, grade 1, with normal LV systolic function and EF of 55-60%. This is common with aging. She is asymptomatic and performs daily activities without limitations. Emphasized importance of managing blood pressure and cholesterol. - Monitor blood pressure and cholesterol levels - Encourage regular physical activity and  healthy diet  Abnormal ECG -ECHO was reassuring. No WMA. ECG showed similar R wave progression previously.   Hypertension Hypertension is well-controlled with losartan hydrochlorothiazide 100 mg/25 mg. No chest pain or dyspnea reported. Blood pressure management is crucial to prevent diastolic dysfunction progression. - Continue losartan hydrochlorothiazide 100 mg/25 mg - Encourage daily exercise and adherence to a Mediterranean diet  Hyperlipidemia Hyperlipidemia managed with pravastatin. Recent cholesterol level is 195 mg/dL, with LDL at 74 mg/dL. Goal is to maintain LDL below 70 mg/dL. Discussed potential switch to rosuvastatin 10 mg if coronary calcium score indicates plaque. She expressed interest in coronary calcium score due to family history of heart disease. - Order coronary calcium score, $99 - Consider switching pravastatin to rosuvastatin 10 mg if coronary calcium score shows plaque         Dispo: Will follow up with testing.   Signed, Dorothye Gathers, MD

## 2024-05-02 NOTE — Patient Instructions (Signed)
 Medication Instructions:  The current medical regimen is effective;  continue present plan and medications.  *If you need a refill on your cardiac medications before your next appointment, please call your pharmacy*  Testing/Procedures: Your physician has requested that you have a Coronary Calcium score which is completed by CT. Cardiac computed tomography (CT) is a painless test that uses an x-ray machine to take clear, detailed pictures of your heart. There are no instructions for this testing.  You may eat/drink and take your normal medications this day.  The cost of the testing is $99 due at the time of your appointment.  Follow-Up: At [facility], you and your health needs are our priority.  As part of our continuing mission to provide you with exceptional heart care, our providers are all part of one team.  This team includes your primary Cardiologist (physician) and Advanced Practice Providers or APPs (Physician Assistants and Nurse Practitioners) who all work together to provide you with the care you need, when you need it.  Your next appointment:   Follow up as needed.   We recommend signing up for the patient portal called "MyChart".  Sign up information is provided on this After Visit Summary.  MyChart is used to connect with patients for Virtual Visits (Telemedicine).  Patients are able to view lab/test results, encounter notes, upcoming appointments, etc.  Non-urgent messages can be sent to your provider as well.   To learn more about what you can do with MyChart, go to [email or web address].

## 2024-05-22 ENCOUNTER — Ambulatory Visit: Payer: Medicare Other | Admitting: Podiatry

## 2024-07-19 ENCOUNTER — Encounter: Payer: Self-pay | Admitting: Podiatry

## 2024-07-19 ENCOUNTER — Ambulatory Visit: Admitting: Podiatry

## 2024-07-19 DIAGNOSIS — M79674 Pain in right toe(s): Secondary | ICD-10-CM

## 2024-07-19 DIAGNOSIS — M79675 Pain in left toe(s): Secondary | ICD-10-CM | POA: Diagnosis not present

## 2024-07-19 DIAGNOSIS — B351 Tinea unguium: Secondary | ICD-10-CM

## 2024-07-19 NOTE — Progress Notes (Signed)
 This patient returns to the office for evaluation and treatment of long thick painful nails .  This patient is unable to trim her own nails since the patient cannot reach her feet.  Patient says the nails are painful walking and wearing his shoes.  She returns for preventive foot care services.  General Appearance  Alert, conversant and in no acute stress.  Vascular  Dorsalis pedis and posterior tibial  pulses are palpable  bilaterally.  Capillary return is within normal limits  bilaterally. Temperature is within normal limits  bilaterally.  Neurologic  Senn-Weinstein monofilament wire test within normal limits  bilaterally. Muscle power within normal limits bilaterally.  Nails Thick disfigured discolored nails with subungual debris  from hallux to fifth toes bilaterally. No evidence of bacterial infection or drainage bilaterally.  Orthopedic  No limitations of motion  feet .  No crepitus or effusions noted.  No bony pathology or digital deformities noted. 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  Skin  normotropic skin with no porokeratosis noted bilaterally.  No signs of infections or ulcers noted.     Onychomycosis  Pain in toes right foot  Pain in toes left foot  Debridement  of nails  1-5  B/L with a nail nipper.  Nails were then filed using a dremel tool with no incidents.    RTC  4 months    Helane Gunther DPM

## 2024-08-29 ENCOUNTER — Other Ambulatory Visit (HOSPITAL_COMMUNITY): Payer: Self-pay

## 2024-10-18 ENCOUNTER — Ambulatory Visit: Admitting: Podiatry

## 2024-11-01 ENCOUNTER — Encounter: Payer: Self-pay | Admitting: Podiatry

## 2024-11-01 ENCOUNTER — Ambulatory Visit: Admitting: Podiatry

## 2024-11-01 DIAGNOSIS — B351 Tinea unguium: Secondary | ICD-10-CM

## 2024-11-01 DIAGNOSIS — M79674 Pain in right toe(s): Secondary | ICD-10-CM | POA: Diagnosis not present

## 2024-11-01 DIAGNOSIS — M79675 Pain in left toe(s): Secondary | ICD-10-CM | POA: Diagnosis not present

## 2024-11-01 NOTE — Progress Notes (Signed)
 This patient returns to the office for evaluation and treatment of long thick painful nails .  This patient is unable to trim her own nails since the patient cannot reach her feet.  Patient says the nails are painful walking and wearing his shoes.  She returns for preventive foot care services.  General Appearance  Alert, conversant and in no acute stress.  Vascular  Dorsalis pedis and posterior tibial  pulses are palpable  bilaterally.  Capillary return is within normal limits  bilaterally. Temperature is within normal limits  bilaterally.  Neurologic  Senn-Weinstein monofilament wire test within normal limits  bilaterally. Muscle power within normal limits bilaterally.  Nails Thick disfigured discolored nails with subungual debris  from hallux to fifth toes bilaterally. No evidence of bacterial infection or drainage bilaterally.  Orthopedic  No limitations of motion  feet .  No crepitus or effusions noted.  No bony pathology or digital deformities noted. 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  Skin  normotropic skin with no porokeratosis noted bilaterally.  No signs of infections or ulcers noted.     Onychomycosis  Pain in toes right foot  Pain in toes left foot  Debridement  of nails  1-5  B/L with a nail nipper.  Nails were then filed using a dremel tool with no incidents.    RTC  4 months    Helane Gunther DPM

## 2024-11-08 ENCOUNTER — Other Ambulatory Visit: Payer: Self-pay | Admitting: Obstetrics and Gynecology

## 2024-11-08 DIAGNOSIS — Z1231 Encounter for screening mammogram for malignant neoplasm of breast: Secondary | ICD-10-CM

## 2024-12-04 ENCOUNTER — Ambulatory Visit
Admission: RE | Admit: 2024-12-04 | Discharge: 2024-12-04 | Disposition: A | Discharge status: Home / Self Care | Source: Ambulatory Visit | Attending: Obstetrics and Gynecology | Admitting: Obstetrics and Gynecology

## 2024-12-04 DIAGNOSIS — Z1231 Encounter for screening mammogram for malignant neoplasm of breast: Secondary | ICD-10-CM

## 2025-01-08 ENCOUNTER — Ambulatory Visit (HOSPITAL_COMMUNITY)
Admission: RE | Admit: 2025-01-08 | Discharge: 2025-01-08 | Disposition: A | Discharge status: Home / Self Care | Payer: Self-pay | Source: Ambulatory Visit | Attending: Occupational Medicine | Admitting: Occupational Medicine

## 2025-01-08 ENCOUNTER — Other Ambulatory Visit (HOSPITAL_COMMUNITY): Payer: Self-pay | Admitting: Occupational Medicine

## 2025-01-08 DIAGNOSIS — Z026 Encounter for examination for insurance purposes: Secondary | ICD-10-CM | POA: Diagnosis present

## 2025-03-05 ENCOUNTER — Ambulatory Visit: Admitting: Podiatry
